# Patient Record
Sex: Male | Born: 1970 | Race: White | Hispanic: No | Marital: Married | Smoking: Former smoker
Health system: Southern US, Community
[De-identification: ages and names within clinical notes are randomized; demographics above are authoritative.]

## PROBLEM LIST (undated history)

## (undated) DIAGNOSIS — G90529 Complex regional pain syndrome I of unspecified lower limb: Secondary | ICD-10-CM

## (undated) DIAGNOSIS — T8859XA Other complications of anesthesia, initial encounter: Secondary | ICD-10-CM

## (undated) DIAGNOSIS — K449 Diaphragmatic hernia without obstruction or gangrene: Secondary | ICD-10-CM

## (undated) DIAGNOSIS — E119 Type 2 diabetes mellitus without complications: Secondary | ICD-10-CM

## (undated) DIAGNOSIS — Z9989 Dependence on other enabling machines and devices: Secondary | ICD-10-CM

## (undated) DIAGNOSIS — G4733 Obstructive sleep apnea (adult) (pediatric): Secondary | ICD-10-CM

## (undated) DIAGNOSIS — M1990 Unspecified osteoarthritis, unspecified site: Secondary | ICD-10-CM

## (undated) DIAGNOSIS — E669 Obesity, unspecified: Secondary | ICD-10-CM

## (undated) DIAGNOSIS — E785 Hyperlipidemia, unspecified: Secondary | ICD-10-CM

## (undated) DIAGNOSIS — E349 Endocrine disorder, unspecified: Secondary | ICD-10-CM

## (undated) DIAGNOSIS — K219 Gastro-esophageal reflux disease without esophagitis: Secondary | ICD-10-CM

## (undated) DIAGNOSIS — Z8489 Family history of other specified conditions: Secondary | ICD-10-CM

## (undated) DIAGNOSIS — I2510 Atherosclerotic heart disease of native coronary artery without angina pectoris: Secondary | ICD-10-CM

## (undated) HISTORY — DX: Endocrine disorder, unspecified: E34.9

## (undated) HISTORY — PX: FINGER SURGERY: SHX640

## (undated) HISTORY — PX: WISDOM TOOTH EXTRACTION: SHX21

## (undated) HISTORY — DX: Atherosclerotic heart disease of native coronary artery without angina pectoris: I25.10

## (undated) HISTORY — DX: Obesity, unspecified: E66.9

## (undated) HISTORY — DX: Gastro-esophageal reflux disease without esophagitis: K21.9

## (undated) HISTORY — DX: Type 2 diabetes mellitus without complications: E11.9

## (undated) HISTORY — DX: Obstructive sleep apnea (adult) (pediatric): G47.33

## (undated) HISTORY — DX: Dependence on other enabling machines and devices: Z99.89

## (undated) HISTORY — DX: Hyperlipidemia, unspecified: E78.5

## (undated) HISTORY — PX: HERNIA REPAIR: SHX51

## (undated) HISTORY — PX: CHOLECYSTECTOMY: SHX55

## (undated) HISTORY — DX: Diaphragmatic hernia without obstruction or gangrene: K44.9

## (undated) HISTORY — PX: COLONOSCOPY: SHX174

## (undated) HISTORY — PX: LEG SURGERY: SHX1003

---

## 1996-11-16 HISTORY — PX: LEG SURGERY: SHX1003

## 2010-04-29 ENCOUNTER — Encounter: Admission: RE | Admit: 2010-04-29 | Discharge: 2010-04-29 | Payer: Self-pay | Admitting: Gastroenterology

## 2012-04-05 DIAGNOSIS — K219 Gastro-esophageal reflux disease without esophagitis: Secondary | ICD-10-CM

## 2012-04-05 DIAGNOSIS — K449 Diaphragmatic hernia without obstruction or gangrene: Secondary | ICD-10-CM | POA: Insufficient documentation

## 2012-04-05 DIAGNOSIS — G4733 Obstructive sleep apnea (adult) (pediatric): Secondary | ICD-10-CM

## 2012-04-05 HISTORY — DX: Obstructive sleep apnea (adult) (pediatric): G47.33

## 2012-04-05 HISTORY — DX: Diaphragmatic hernia without obstruction or gangrene: K44.9

## 2012-04-05 HISTORY — DX: Gastro-esophageal reflux disease without esophagitis: K21.9

## 2012-12-01 DIAGNOSIS — E785 Hyperlipidemia, unspecified: Secondary | ICD-10-CM

## 2012-12-01 HISTORY — DX: Hyperlipidemia, unspecified: E78.5

## 2015-10-14 ENCOUNTER — Telehealth: Payer: Self-pay

## 2015-10-14 NOTE — Telephone Encounter (Signed)
[facility], [facility]

## 2016-08-05 ENCOUNTER — Encounter: Payer: Self-pay | Admitting: Cardiology

## 2016-08-05 ENCOUNTER — Telehealth: Payer: Self-pay | Admitting: Cardiology

## 2016-08-05 NOTE — Telephone Encounter (Signed)
Called pt and left message asking pt to call back to update Fm and medical Hx. °

## 2016-08-06 ENCOUNTER — Encounter: Payer: Self-pay | Admitting: Cardiology

## 2016-08-20 ENCOUNTER — Ambulatory Visit: Payer: Self-pay | Admitting: Cardiology

## 2017-08-13 ENCOUNTER — Telehealth: Payer: Self-pay | Admitting: *Deleted

## 2017-08-13 NOTE — Telephone Encounter (Signed)
NOTES SENT TO SCHEDULING.  °

## 2017-09-07 ENCOUNTER — Encounter: Payer: Self-pay | Admitting: Cardiology

## 2017-09-09 ENCOUNTER — Telehealth: Payer: Self-pay | Admitting: Cardiology

## 2017-09-09 DIAGNOSIS — G4733 Obstructive sleep apnea (adult) (pediatric): Secondary | ICD-10-CM

## 2017-09-09 NOTE — Telephone Encounter (Signed)
Message sent to Dr Radford Pax, Patient thinks he needs his pressure changed he has gained about 50 pound since he saw you last at [facility] in 2010. His machine is over 48 years old.   Patient was wondering if he needs a sleep study before his appt. If he needs a sleep study he wants to get that and his new machine before the end of the year because he has already met his deductible. Please advise Thanks, Gae Bon

## 2017-09-09 NOTE — Telephone Encounter (Signed)
Patient was wondering if he needs a sleep study before his appt. [facility] May[facility]er.

## 2017-09-09 NOTE — Telephone Encounter (Signed)
New message    Pam is calling to find out if anyone has talked to Dr. Mayford Knifeurner about this pt. She said she will be there until right before 5.

## 2017-09-15 ENCOUNTER — Telehealth: Payer: Self-pay | Admitting: *Deleted

## 2017-09-15 DIAGNOSIS — G4733 Obstructive sleep apnea (adult) (pediatric): Secondary | ICD-10-CM

## 2017-09-15 NOTE — Telephone Encounter (Signed)
-----   Message from Quintella Reichertraci R Turner, MD sent at 09/15/2017  3:26 PM EDT ----- Please order a CPAP titration in lab for new CPAP device

## 2017-09-15 NOTE — Telephone Encounter (Signed)
Informed patient of compliance results and verbalized understanding was indicated. Patient understands his apnea events are in normal range at 2.1. Patient understands his compliance is good and his current settings will not change. Patient was grateful for the call and thanked me.

## 2017-09-15 NOTE — Telephone Encounter (Signed)
-----   Message from Quintella Reichertraci R Turner, MD sent at 09/15/2017  3:25 PM EDT ----- Good AHI and compliance.  Continue current CPAP settings.

## 2017-09-16 ENCOUNTER — Encounter: Payer: Self-pay | Admitting: *Deleted

## 2017-09-16 NOTE — Telephone Encounter (Signed)
-----  Message from Sueanne Margarita, MD sent at 09/09/2017 11:49 PM EDT ----- Regarding: RE: PTT. or Sleep study Set up an in labCPAP titration  Traci Turner ----- Message ----- From: Freada Bergeron, CMA Sent: 09/09/2017   6:30 PM To: Sueanne Margarita, MD Subject: PTT. or Sleep study                            Patient thinks he needs his pressure changed he has gained about 50 pound since he saw you last at [facility] in 2010. His machine is over 31 years old.   Patient was wondering if he needs a sleep study before his appt. If he needs a sleep study he wants to get that and his new machine before the end of the year because he has already met his deductible. Please advise Thanks, Gae Bon

## 2017-09-16 NOTE — Telephone Encounter (Signed)
Informed patient of upcoming CPAP Titration and patient understanding was verbalized. Patient understands his Titration is scheduled for October 26 2017. Patient understands his Titration study will be done at [facility] sleep lab. Patient understands he will receive a sleep packet in a week or so. Patient understands to call if he does not receive the sleep packet in a timely manner. Patient agrees with treatment and thanked me for call.  

## 2017-09-22 ENCOUNTER — Encounter: Payer: Self-pay | Admitting: Cardiology

## 2017-09-22 ENCOUNTER — Telehealth: Payer: Self-pay | Admitting: *Deleted

## 2017-09-22 ENCOUNTER — Ambulatory Visit (INDEPENDENT_AMBULATORY_CARE_PROVIDER_SITE_OTHER): Payer: Managed Care, Other (non HMO) | Admitting: Cardiology

## 2017-09-22 VITALS — BP 128/92 | HR 93 | Ht 72.0 in | Wt 239.8 lb

## 2017-09-22 DIAGNOSIS — R42 Dizziness and giddiness: Secondary | ICD-10-CM | POA: Diagnosis not present

## 2017-09-22 DIAGNOSIS — G4733 Obstructive sleep apnea (adult) (pediatric): Secondary | ICD-10-CM | POA: Diagnosis not present

## 2017-09-22 DIAGNOSIS — R0602 Shortness of breath: Secondary | ICD-10-CM | POA: Diagnosis not present

## 2017-09-22 HISTORY — DX: Obstructive sleep apnea (adult) (pediatric): G47.33

## 2017-09-22 NOTE — Telephone Encounter (Signed)
PAP assistant spoke with the patient in the office today and informed him that the Air Mini CPAP only records his hours used and not his compliance where the results can be downloaded and reviewed at his office visit.  Patient was informed he can get a prescription for an air mini from Dr Mayford Knifeurner just for travel but not in the place of the regular CPAP machine.

## 2017-09-22 NOTE — Telephone Encounter (Signed)
-----   Message from AbercrombieDonnisha Robertson, RN sent at 09/22/2017  2:37 PM EST ----- Regarding: CPAP order CPAP order placed. FYI patient would like a travel friendly machine. Thanks

## 2017-09-22 NOTE — Patient Instructions (Signed)
Medication Instructions:  Your physician recommends that you continue on your current medications as directed. Please refer to the Current Medication list given to you today.  Labwork: None ordered  Testing/Procedures: Your physician has requested that you have an exercise tolerance test. For further information please visit [email or web address]. Please also follow instruction sheet, as given.   Follow-Up: Your physician wants you to follow-up in: 1 year with Dr. Mayford Knifeurner. You will receive a reminder letter in the mail two months in advance. If you don't receive a letter, please call our office to schedule the follow-up appointment.  Any Other Special Instructions Will Be Listed Below (If Applicable).     If you need a refill on your cardiac medications before your next appointment, please call your pharmacy.

## 2017-09-22 NOTE — Progress Notes (Signed)
Cardiology Office Note:    Date:  09/22/2017   ID:  Jorge [name], DOB 1971-02-14, MRN [id number]  PCP:  Warden Fillers, MD  Cardiologist:  Armanda Magic, MD   Referring MD: Warden Fillers, MD   Chief Complaint  Patient presents with  . Sleep Apnea    History of Present Illness:    Jorge [name] is a 46 y.o. male with a hx of OSA on CPAP, DM and obesity and is referred here for evaluation of OSA by his PCP Warden Fillers, MD.    He is here to get reestablished with a sleep MD.  He is doing well with his CPAP device but the device is not working as well for him and is shuttig off at night.  He would like to get a new machine as his is not working well and is old.  He tolerates the nasal pillow mask which works well for him and feels the pressure is adequate.  He sleeps well but when he wakes up in the am he does not always feel rested.  He does not nap during the day.  He denies any significant mouth or nasal dryness or nasal congestion.  He does not know if  he snores.  He also says that he is concerned about his heart.  He says that he intermittently gets an achiness in his jaw.  He cannot tell if it was from when he got all his teeth pulled.  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  He had another episode while at the beach working outside in the heat and again felt dizzy.  He denies any palpitations or syncope.  He says that occasionally he will have some right sided CP that he describes as a subtle discomfort that he has a hard time to describe.  He has DOE that is chronic and stable.     Past Medical History:  Diagnosis Date  . Diabetes mellitus type 2, controlled (HCC)   . GERD (gastroesophageal reflux disease) 04/05/2012  . Hiatal hernia 04/05/2012  . Hyperlipidemia 12/01/2012   HE PREFERS TO USE NIACIN AND BRAN TO LOWER HIS TRIGLYCERIDES AND CHOLESTEROL  . Obesity   . OSA (obstructive sleep  apnea) 09/22/2017  . OSA on CPAP 04/05/2012  . Testosterone deficiency     Past Surgical History:  Procedure Laterality Date  . CHOLECYSTECTOMY    . FINGER SURGERY    . HERNIA REPAIR    . LEG SURGERY Right   . WISDOM TOOTH EXTRACTION      Current Medications: Current Meds  Medication Sig  . aspirin 81 MG chewable tablet Chew 81 mg by mouth daily.  Marland Kitchen CINNAMON PO Take 1 tablet by mouth daily.  Marland Kitchen DAPAGLIFLOZIN-METFORMIN HCL ER PO Take 1 tablet by mouth 2 (two) times daily. UNKNOWN STRENGTH  . esomeprazole (NEXIUM) 20 MG capsule Take 20 mg by mouth 2 (two) times daily before a meal.  . GARLIC PO Take 1 tablet by mouth daily.  Marland Kitchen ibuprofen (ADVIL,MOTRIN) 800 MG tablet Take 800 mg by mouth every 8 (eight) hours as needed for moderate pain.  Marland Kitchen NIACIN CR PO Take 1 tablet by mouth daily.  Marland Kitchen OAT BRAN PO Take 1 Dose by mouth 2 (two) times daily.  . Omega-3 Fatty Acids (FISH OIL) 1000 MG CAPS Take 1 capsule by mouth 2 (two) times daily.  Kirt[place] Sink,  DISP, 3 ML 23G X 1" 3 ML MISC 1 each by Does not apply route. 1 EACH BY DOES NOT APPLY ROUTE EVERY 10 DAYS  . testosterone cypionate (DEPOTESTOSTERONE CYPIONATE) 200 MG/ML injection Inject 200 mg into the muscle. INJECT 1 ML (200 MG TOTAL) INTO THE MUSCLE EVERY 10 DAYS     Allergies:   Contrast media [iodinated diagnostic agents] and Lipitor [atorvastatin]   Social History   Socioeconomic History  . Marital status: Married    Spouse name: None  . Number of children: None  . Years of education: None  . Highest education level: None  Social Needs  . Financial resource strain: None  . Food insecurity - worry: None  . Food insecurity - inability: None  . Transportation needs - medical: None  . Transportation needs - non-medical: None  Occupational History  . None  Tobacco Use  . Smoking status: Former Smoker    Last attempt to quit: 09/22/2012    Years since quitting: 5.0  . Smokeless tobacco: Never Used  Substance and Sexual  Activity  . Alcohol use: No  . Drug use: No  . Sexual activity: None  Other Topics Concern  . None  Social History Narrative  . None     Family History: The patient's family history includes Alzheimer's disease in his mother; Valvular heart disease in his brother and father.  ROS:   Please see the history of present illness.    ROS  All other systems reviewed and negative.   EKGs/Labs/Other Studies Reviewed:    The following studies were reviewed today: CPAP download  EKG:  EKG is ordered today and showed NSR at 89bpm with no ST changes  Recent Labs: No results found for requested labs within last 8760 hours.   Recent Lipid Panel No results found for: CHOL, TRIG, HDL, CHOLHDL, VLDL, LDLCALC, LDLDIRECT  Physical Exam:    VS:  BP (!) 128/92   Pulse 93   Ht 6' (1.829 m)   Wt 239 lb 12.8 oz (108.8 kg)   SpO2 97%   BMI 32.52 kg/m     Wt Readings from Last 3 Encounters:  09/22/17 239 lb 12.8 oz (108.8 kg)     GEN:  Well nourished, well developed in no acute distress HEENT: Normal NECK: No JVD; No carotid bruits LYMPHATICS: No lymphadenopathy CARDIAC: RRR, no murmurs, rubs, gallops RESPIRATORY:  Clear to auscultation without rales, wheezing or rhonchi  ABDOMEN: Soft, non-tender, non-distended MUSCULOSKELETAL:  No edema; No deformity  SKIN: Warm and dry NEUROLOGIC:  Alert and oriented x 3 PSYCHIATRIC:  Normal affect   ASSESSMENT:    1. OSA (obstructive sleep apnea)   2. SOB (shortness of breath)   3. Dizziness    PLAN:    In order of problems listed above:  1.  OSA - the patient is tolerating PAP therapy well without any problems. The patient has been using and benefiting from CPAP use and will continue to benefit from therapy.  I will order a new ResMed CPAP with heated humidity.  We will actually get a 2-week auto titration from 4-18 cm water pressure to make sure he is on an adequate pressure setting.  2.  SOB -unclear if this is related to underlying  obesity.  He has no family history of coronary disease but does have a remote history of smoking and is a diabetic so he does have risk factors for coronary disease.  His EKG shows normal sinus rhythm with no ST  changes so I will order a exercise treadmill test to rule out ischemia.  3.  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  Suspect he got dehydrated.  EKG is normal.  He denies any history of palpitations.  He has had no syncopal episodes.     Medication Adjustments/Labs and Tests Ordered: Current medicines are reviewed at length with the patient today.  Concerns regarding medicines are outlined above.  No orders of the defined types were placed in this encounter.  No orders of the defined types were placed in this encounter.   Signed, Armanda Magicraci Turner, MD  09/22/2017 2:22 PM    [facility]

## 2017-09-23 NOTE — Telephone Encounter (Signed)
Informed patient of upcoming CPAP Titration and patient understanding was verbalized. Patient understands his Titration is scheduled for October 26 2017. Patient understands [facility] Patient understands he will receive a sleep packet in a week or so. Patient understands to call if he does not receive the sleep packet in a timely manner. Patient agrees with treatment and thanked me for call.

## 2017-09-23 NOTE — Telephone Encounter (Signed)
  -----   Message from Quintella Reichertraci R Turner, MD sent at 09/09/2017 11:49 PM EDT ----- Regarding: RE: PTT. or Sleep study Set up an in labCPAP titration  Armanda Magicraci Turner

## 2017-10-19 ENCOUNTER — Ambulatory Visit (INDEPENDENT_AMBULATORY_CARE_PROVIDER_SITE_OTHER): Payer: Managed Care, Other (non HMO)

## 2017-10-19 DIAGNOSIS — R0602 Shortness of breath: Secondary | ICD-10-CM

## 2017-10-19 LAB — EXERCISE TOLERANCE TEST
CSEPED: 6 min
CSEPEDS: 16 s
CSEPEW: 7.4 METS
CSEPHR: 90 %
CSEPPHR: 155 {beats}/min
MPHR: 172 {beats}/min
RPE: 14
Rest HR: 90 {beats}/min

## 2017-10-20 ENCOUNTER — Telehealth: Payer: Self-pay

## 2017-10-20 DIAGNOSIS — I10 Essential (primary) hypertension: Secondary | ICD-10-CM

## 2017-10-20 NOTE — Telephone Encounter (Signed)
Notes recorded by Phineas Semenobertson, Adonus Uselman, RN on 10/20/2017 at 12:46 PM EST Reviewed results with patient. Informed patient that Dr. Mayford Knifeurner recommends a 24 BP monitor due to high BP. Informed patient that our schedulers will contact him to make that appointment. Patient verbalized understanding and thanked me for the call.  Notes recorded by Quintella Reicherturner, Traci R, MD on 10/20/2017 at 5:35 AM EST Stress test normal but BP too high- please order a 24 hour BP monitor

## 2017-10-20 NOTE — Progress Notes (Signed)
Stress test normal but BP too high- please order a 24 hour BP monitor

## 2017-10-26 ENCOUNTER — Ambulatory Visit (HOSPITAL_BASED_OUTPATIENT_CLINIC_OR_DEPARTMENT_OTHER): Payer: Managed Care, Other (non HMO) | Attending: Cardiology | Admitting: Cardiology

## 2017-10-26 VITALS — Ht 72.0 in | Wt 240.0 lb

## 2017-10-26 DIAGNOSIS — G4733 Obstructive sleep apnea (adult) (pediatric): Secondary | ICD-10-CM | POA: Diagnosis present

## 2017-11-03 NOTE — Progress Notes (Signed)
   NAME: Jorge [name] DATE OF BIRTH:  03/16/1971 MEDICAL RECORD:  [id number]  LOCATION: [facility]  PHYSICIAN: Lynard Postlewait  DATE OF STUDY: 10/26/2017  SLEEP STUDY TYPE: Positive Airway Pressure Titration               REFERRING PHYSICIAN: Akirra Lacerda R, MD  INDICATION FOR STUDY: OSA  HEIGHT: 6' (182.9 cm)  WEIGHT: 240 lb (108.9 kg)    Body mass index is 32.55 kg/m.  NECK SIZE: 17 in.  CLINICAL INFORMATION The patient is referred for a CPAP titration to treat sleep apnea.  SLEEP STUDY TECHNIQUE As per the AASM Manual for the Scoring of Sleep and Associated Events v2.3 (April 2016) with a hypopnea requiring 4% desaturations.  The channels recorded and monitored were frontal, central and occipital EEG, electrooculogram (EOG), submentalis EMG (chin), nasal and oral airflow, thoracic and abdominal wall motion, anterior tibialis EMG, snore microphone, electrocardiogram, and pulse oximetry. Continuous positive airway pressure (CPAP) was initiated at the beginning of the study and titrated to treat sleep-disordered breathing.  MEDICATIONS Medications self-administered by patient taken the night of the study : PIOGLITAZONE, XIGDUO XR  TECHNICIAN COMMENTS Comments added by technician: PATIENT WAS ORDERED AS A CPAP TITRATION. Comments added by scorer: N/A  RESPIRATORY PARAMETERS Optimal PAP Pressure (cm): 14  AHI at Optimal Pressure (/hr):0.0 Overall Minimal O2 (%):85.00  Supine % at Optimal Pressure (%):100 Minimal O2 at Optimal Pressure (%): 91.0   SLEEP ARCHITECTURE The study was initiated at 10:47:24 PM and ended at 5:25:09 AM.  Sleep onset time was 5.6 minutes and the sleep efficiency was 96.4%. The total sleep time was 383.5 minutes.  The patient spent 4.82% of the night in stage N1 sleep, 62.19% in stage N2 sleep, 5.61% in stage N3 and 27.38% in REM.Stage REM latency was 85.5 minutes  Wake after sleep onset was 8.6. Alpha intrusion was absent.  Supine sleep was 83.96%.  CARDIAC DATA The 2 lead EKG demonstrated sinus rhythm. The mean heart rate was 76.33 beats per minute. Other EKG findings include: PVCs.  LEG MOVEMENT DATA The total Periodic Limb Movements of Sleep (PLMS) were 269. The PLMS index was 42.09. A PLMS index of <15 is considered normal in adults.  IMPRESSIONS - The optimal PAP pressure was 14 cm of water. - Central sleep apnea was not noted during this titration (CAI = 0.6/h). - Moderate oxygen desaturations were observed during this titration (min O2 = 85.00%). - The patient snored with soft snoring volume during this titration study. - 2-lead EKG demonstrated: PVCs - Moderate periodic limb movements were observed during this study. Arousals associated with PLMs were rare.  DIAGNOSIS - Obstructive Sleep Apnea (327.23 [G47.33 ICD-10])  RECOMMENDATIONS - Trial of CPAP therapy on 14 cm H2O with a Large size Philips Respironics Nasal Pillow Mask Nuance Pro Gel mask and heated humidification. - Avoid alcohol, sedatives and other CNS depressants that may worsen sleep apnea and disrupt normal sleep architecture. - Sleep hygiene should be reviewed to assess factors that may improve sleep quality. - Weight management and regular exercise should be initiated or continued. - Return to Sleep Center for re-evaluation after 10 weeks of therapy  Martyn Timme Diplomate, American Board of Sleep Medicine  ELECTRONICALLY SIGNED ON:  11/03/2017, 9:11 PM [facility] PH: [phone]   FX: [phone] ACCREDITED BY THE AMERICAN ACADEMY OF SLEEP MEDICINE  

## 2017-11-03 NOTE — Procedures (Signed)
   NAME: Jorge BreslowBruce [name] DATE OF BIRTH:  10/19/1971 MEDICAL RECORD NUMBER [id number]  LOCATION: [facility]  PHYSICIAN: Tai Skelly  DATE OF STUDY: 10/26/2017  SLEEP STUDY TYPE: Positive Airway Pressure Titration               REFERRING PHYSICIAN: Quintella Reicherturner, Lacrisha Bielicki R, MD  INDICATION FOR STUDY: OSA  HEIGHT: 6' (182.9 cm)  WEIGHT: 240 lb (108.9 kg)    Body mass index is 32.55 kg/m.  NECK SIZE: 17 in.  CLINICAL INFORMATION The patient is referred for a CPAP titration to treat sleep apnea.  SLEEP STUDY TECHNIQUE As per the AASM Manual for the Scoring of Sleep and Associated Events v2.3 (April 2016) with a hypopnea requiring 4% desaturations.  The channels recorded and monitored were frontal, central and occipital EEG, electrooculogram (EOG), submentalis EMG (chin), nasal and oral airflow, thoracic and abdominal wall motion, anterior tibialis EMG, snore microphone, electrocardiogram, and pulse oximetry. Continuous positive airway pressure (CPAP) was initiated at the beginning of the study and titrated to treat sleep-disordered breathing.  MEDICATIONS Medications self-administered by patient taken the night of the study : PIOGLITAZONE, XIGDUO XR  TECHNICIAN COMMENTS Comments added by technician: PATIENT WAS ORDERED AS A CPAP TITRATION. Comments added by scorer: N/A  RESPIRATORY PARAMETERS Optimal PAP Pressure (cm): 14  AHI at Optimal Pressure (/hr):0.0 Overall Minimal O2 (%):85.00  Supine % at Optimal Pressure (%):100 Minimal O2 at Optimal Pressure (%): 91.0   SLEEP ARCHITECTURE The study was initiated at 10:47:24 PM and ended at 5:25:09 AM.  Sleep onset time was 5.6 minutes and the sleep efficiency was 96.4%. The total sleep time was 383.5 minutes.  The patient spent 4.82% of the night in stage N1 sleep, 62.19% in stage N2 sleep, 5.61% in stage N3 and 27.38% in REM.Stage REM latency was 85.5 minutes  Wake after sleep onset was 8.6. Alpha intrusion was absent.  Supine sleep was 83.96%.  CARDIAC DATA The 2 lead EKG demonstrated sinus rhythm. The mean heart rate was 76.33 beats per minute. Other EKG findings include: PVCs.  LEG MOVEMENT DATA The total Periodic Limb Movements of Sleep (PLMS) were 269. The PLMS index was 42.09. A PLMS index of <15 is considered normal in adults.  IMPRESSIONS - The optimal PAP pressure was 14 cm of water. - Central sleep apnea was not noted during this titration (CAI = 0.6/h). - Moderate oxygen desaturations were observed during this titration (min O2 = 85.00%). - The patient snored with soft snoring volume during this titration study. - 2-lead EKG demonstrated: PVCs - Moderate periodic limb movements were observed during this study. Arousals associated with PLMs were rare.  DIAGNOSIS - Obstructive Sleep Apnea (327.23 [G47.33 ICD-10])  RECOMMENDATIONS - Trial of CPAP therapy on 14 cm H2O with a Large size Philips Respironics Nasal Pillow Mask Nuance Pro Gel mask and heated humidification. - Avoid alcohol, sedatives and other CNS depressants that may worsen sleep apnea and disrupt normal sleep architecture. - Sleep hygiene should be reviewed to assess factors that may improve sleep quality. - Weight management and regular exercise should be initiated or continued. - Return to Sleep Center for re-evaluation after 10 weeks of therapy  Armanda Magicraci Solan Vosler Diplomate, American Board of Sleep Medicine  ELECTRONICALLY SIGNED ON:  11/03/2017, 9:11 PM [place] [facility] PH: [phone]   FX: [phone] ACCREDITED BY THE AMERICAN ACADEMY OF SLEEP MEDICINE

## 2017-11-04 ENCOUNTER — Telehealth: Payer: Self-pay | Admitting: *Deleted

## 2017-11-04 NOTE — Telephone Encounter (Signed)
-----   Message from Quintella Reichertraci R Turner, MD sent at 11/03/2017  9:15 PM EST ----- Please let patient know that they had a successful PAP titration and let DME know that orders are in EPIC.  Please set up 10 week OV with me.

## 2017-11-04 NOTE — Telephone Encounter (Deleted)
  Turner, Traci R, MD  Magali Bray G, CMA        Please let patient know that they had a successful PAP titration and let DME know that orders are in EPIC. Please set up 10 week OV with me.     

## 2017-11-04 NOTE — Telephone Encounter (Signed)
Informed patient of sleep study results and patient understanding was verbalized. Patient understands he had a successful CPAP Titration and Dr Mayford Knifeurner has ordered him a CPAP.  Patient understands [facility]  Patient understands he needs a recent appointment with Dr Mayford Knifeurner to obtain his new CPAP unit. He understands to call if [facility] not [facility] with new setup in a timely manner. He understands he will be called once [facility] has been [facility] that [facility] has received his new machine to schedule 10 week follow up appointment.  [facility] notified of new cpap order in epic Please add to [place] [facility] He was grateful for the call and thanked me

## 2017-11-19 ENCOUNTER — Telehealth: Payer: Self-pay | Admitting: *Deleted

## 2017-11-19 NOTE — Telephone Encounter (Signed)
Patient states he has to pay out of pocket for his CPAP because he has a $5000 deductible for the new year. Patient states he can not afford this unit at this time and he will call back when he figure out how he can afford the unit.

## 2017-11-19 NOTE — Telephone Encounter (Signed)
-----   Message from Traci R Turner, MD sent at 11/03/2017  9:15 PM EST ----- Please let patient know that they had a successful PAP titration and let DME know that orders are in EPIC.  Please set up 10 week OV with me.  

## 2017-11-26 ENCOUNTER — Ambulatory Visit (INDEPENDENT_AMBULATORY_CARE_PROVIDER_SITE_OTHER): Payer: Managed Care, Other (non HMO)

## 2017-11-26 DIAGNOSIS — I10 Essential (primary) hypertension: Secondary | ICD-10-CM | POA: Diagnosis not present

## 2018-01-20 ENCOUNTER — Telehealth: Payer: Self-pay | Admitting: *Deleted

## 2018-01-20 NOTE — Telephone Encounter (Signed)
Patient called back today to say he was ready to move forward and get his CPAP machine. PAP assistant [facility]  So he can get set up.

## 2018-02-17 ENCOUNTER — Telehealth: Payer: Self-pay

## 2018-02-17 NOTE — Telephone Encounter (Signed)
-----   Message from Reesa Chew, CMA sent at 02/17/2018  9:59 AM EDT ----- Regarding: 24 hour monitor Patient calling about results of 24 hour monitor. Please call patient.

## 2018-02-17 NOTE — Telephone Encounter (Signed)
Patient called stating [facility]  [facility]. [facility] spoke to [place] who states the patient was suppose to call [facility] back after checking with his insurance for his financial responsibility to say when he was ready for setup. [place] will reach out to the patient today to set patient up if he is ready.  Reached out to the patient left message to inform him to look for a [facility]  [facility].  [place] would like [place] office.

## 2018-02-17 NOTE — Telephone Encounter (Signed)
I spoke with patient, pt made aware of normal BP monitor results and continue with current mediation therapy. Patient denies any other concerns and thankful for the call.

## 2018-03-21 NOTE — Telephone Encounter (Signed)
Patient has a 10 week follow up appointment scheduled for 05/11/2018 at 2:20. Patient understands she needs to keep this appointment for insurance compliance. Patient was grateful for the call and thanked me.

## 2018-04-01 ENCOUNTER — Telehealth: Payer: Self-pay | Admitting: *Deleted

## 2018-04-01 NOTE — Telephone Encounter (Signed)
Patient is aware and agreeable to AHI being within range at 0.5. Patient is aware and agreeable to being in compliance with machine usage. Patient is aware and agreeable to no change in current pressures.   

## 2018-04-01 NOTE — Telephone Encounter (Signed)
-----   Message from Quintella Reichert, MD sent at 03/30/2018  2:58 PM EDT ----- Good AHI and compliance.  Continue current PAP settings.

## 2018-05-11 ENCOUNTER — Ambulatory Visit (INDEPENDENT_AMBULATORY_CARE_PROVIDER_SITE_OTHER): Payer: Managed Care, Other (non HMO) | Admitting: Cardiology

## 2018-05-11 ENCOUNTER — Encounter: Payer: Self-pay | Admitting: Cardiology

## 2018-05-11 ENCOUNTER — Encounter (INDEPENDENT_AMBULATORY_CARE_PROVIDER_SITE_OTHER): Payer: Self-pay

## 2018-05-11 VITALS — BP 147/79 | HR 95 | Ht 72.0 in | Wt 243.2 lb

## 2018-05-11 DIAGNOSIS — G4733 Obstructive sleep apnea (adult) (pediatric): Secondary | ICD-10-CM | POA: Diagnosis not present

## 2018-05-11 NOTE — Patient Instructions (Signed)
Medication Instructions:  Your physician recommends that you continue on your current medications as directed. Please refer to the Current Medication list given to you today.  If you need a refill on your cardiac medications, please contact your pharmacy first.  Labwork: None ordered   Testing/Procedures: None ordered   Follow-Up: Your physician wants you to follow-up in: 1 year with Dr. Turner. You will receive a reminder letter in the mail two months in advance. If you don't receive a letter, please call our office to schedule the follow-up appointment.  Any Other Special Instructions Will Be Listed Below (If Applicable).   Thank you for choosing [facility]    Rena Aloys Hupfer, RN  [phone]  If you need a refill on your cardiac medications before your next appointment, please call your pharmacy.   

## 2018-05-11 NOTE — Progress Notes (Signed)
Cardiology Office Note:    Date:  05/11/2018   ID:  Jorge BreslowBruce [name], DOB 08/11/1971, MRN [id number]  PCP:  Warden FillersLambeth, John, MD  Cardiologist:  No primary care provider on file.    Referring MD: Warden FillersLambeth, John, MD   Chief Complaint  Patient presents with  . Sleep Apnea    History of Present Illness:    Jorge [name] is a 47 y.o. male with a hx of obstructive sleep apnea on CPAP.  He last saw me last November to establish a new sleep medicine doctor.  He was having problems with his CPAP device to get a new device.  He underwent CPAP titration in the lab to 14 cm H2O.  He is now here for follow-up.He is doing well with his CPAP device and thinks that he has gotten used to it.  He tolerates the mask and feels the pressure is adequate.  Since going on CPAP he feels rested in the am and has no significant daytime sleepiness.  He denies any significant mouth or nasal dryness or nasal congestion.  He does not think that he snores.     Past Medical History:  Diagnosis Date  . Diabetes mellitus type 2, controlled (HCC)   . GERD (gastroesophageal reflux disease) 04/05/2012  . Hiatal hernia 04/05/2012  . Hyperlipidemia 12/01/2012   HE PREFERS TO USE NIACIN AND BRAN TO LOWER HIS TRIGLYCERIDES AND CHOLESTEROL  . Obesity   . OSA (obstructive sleep apnea) 09/22/2017  . OSA on CPAP 04/05/2012  . Testosterone deficiency     Past Surgical History:  Procedure Laterality Date  . CHOLECYSTECTOMY    . FINGER SURGERY    . HERNIA REPAIR    . LEG SURGERY Right   . WISDOM TOOTH EXTRACTION      Current Medications: Current Meds  Medication Sig  . aspirin 81 MG chewable tablet Chew 81 mg by mouth daily.  Marland Kitchen. CINNAMON PO Take 1 tablet by mouth daily.  Marland Kitchen. DAPAGLIFLOZIN-METFORMIN HCL ER PO Take 1 tablet by mouth 2 (two) times daily. UNKNOWN STRENGTH  . esomeprazole (NEXIUM) 20 MG capsule Take 20 mg by mouth 2 (two) times daily before a meal.  . GARLIC PO Take 1 tablet by mouth daily.  Marland Kitchen. ibuprofen  (ADVIL,MOTRIN) 800 MG tablet Take 800 mg by mouth every 8 (eight) hours as needed for moderate pain.  Marland Kitchen. NIACIN CR PO Take 1 tablet by mouth daily.  Marland Kitchen. OAT BRAN PO Take 1 Dose by mouth 2 (two) times daily.  . Omega-3 Fatty Acids (FISH OIL) 1000 MG CAPS Take 1 capsule by mouth 2 (two) times daily.  . SYRINGE-NEEDLE, DISP, 3 ML 23G X 1" 3 ML MISC 1 each by Does not apply route. 1 EACH BY DOES NOT APPLY ROUTE EVERY 10 DAYS  . testosterone cypionate (DEPOTESTOSTERONE CYPIONATE) 200 MG/ML injection Inject 200 mg into the muscle. INJECT 1 ML (200 MG TOTAL) INTO THE MUSCLE EVERY 10 DAYS     Allergies:   Atorvastatin; Iodinated diagnostic agents; and Other   Social History   Socioeconomic History  . Marital status: Married    Spouse name: Not on file  . Number of children: Not on file  . Years of education: Not on file  . Highest education level: Not on file  Occupational History  . Not on file  Social Needs  . Financial resource strain: Not on file  . Food insecurity:    Worry: Not on file    Inability: Not on file  .  Transportation needs:    Medical: Not on file    Non-medical: Not on file  Tobacco Use  . Smoking status: Former Smoker    Last attempt to quit: 09/22/2012    Years since quitting: 5.6  . Smokeless tobacco: Never Used  Substance and Sexual Activity  . Alcohol use: No  . Drug use: No  . Sexual activity: Not on file  Lifestyle  . Physical activity:    Days per week: Not on file    Minutes per session: Not on file  . Stress: Not on file  Relationships  . Social connections:    Talks on phone: Not on file    Gets together: Not on file    Attends religious service: Not on file    Active member of club or organization: Not on file    Attends meetings of clubs or organizations: Not on file    Relationship status: Not on file  Other Topics Concern  . Not on file  Social History Narrative  . Not on file     Family History: The patient's family history includes  Alzheimer's disease in his mother; Valvular heart disease in his brother and father.  ROS:   Please see the history of present illness.    ROS  All other systems reviewed and negative.   EKGs/Labs/Other Studies Reviewed:    The following studies were reviewed today: PAP downlod  EKG:  EKG is not ordered today.    Recent Labs: No results found for requested labs within last 8760 hours.   Recent Lipid Panel No results found for: CHOL, TRIG, HDL, CHOLHDL, VLDL, LDLCALC, LDLDIRECT  Physical Exam:    VS:  Ht 6' (1.829 m)   Wt 243 lb 3.2 oz (110.3 kg)   BMI 32.98 kg/m     Wt Readings from Last 3 Encounters:  05/11/18 243 lb 3.2 oz (110.3 kg)  10/26/17 240 lb (108.9 kg)  09/22/17 239 lb 12.8 oz (108.8 kg)     GEN:  Well nourished, well developed in no acute distress HEENT: Normal NECK: No JVD; No carotid bruits LYMPHATICS: No lymphadenopathy CARDIAC: RRR, no murmurs, rubs, gallops RESPIRATORY:  Clear to auscultation without rales, wheezing or rhonchi  ABDOMEN: Soft, non-tender, non-distended MUSCULOSKELETAL:  No edema; No deformity  SKIN: Warm and dry NEUROLOGIC:  Alert and oriented x 3 PSYCHIATRIC:  Normal affect   ASSESSMENT:    1. OSA (obstructive sleep apnea)    PLAN:    In order of problems listed above:  1.  OSA - the patient is tolerating PAP therapy well without any problems. The PAP download was reviewed today and showed an AHI of 0.4/hr on 14 cm H2O with 73% compliance in using more than 4 hours nightly.  The patient has been using and benefiting from PAP use and will continue to benefit from therapy.      Medication Adjustments/Labs and Tests Ordered: Current medicines are reviewed at length with the patient today.  Concerns regarding medicines are outlined above.  No orders of the defined types were placed in this encounter.  No orders of the defined types were placed in this encounter.   Signed, Armanda Magic, MD  05/11/2018 2:49 PM    [facility]

## 2019-06-28 ENCOUNTER — Telehealth: Payer: Self-pay

## 2019-06-28 NOTE — Telephone Encounter (Signed)
Called to offer virtual visit for patient with Dr Radford Pax (sleep) but he stated he has lost his job and can not afford appointment at this time. He stated he is doing well and no complaints at this time.

## 2021-10-16 ENCOUNTER — Encounter: Payer: Self-pay | Admitting: Cardiology

## 2021-10-24 ENCOUNTER — Ambulatory Visit: Payer: Managed Care, Other (non HMO) | Admitting: Cardiology

## 2022-01-21 ENCOUNTER — Encounter: Payer: Self-pay | Admitting: Cardiology

## 2022-01-21 ENCOUNTER — Ambulatory Visit (INDEPENDENT_AMBULATORY_CARE_PROVIDER_SITE_OTHER): Payer: Commercial Managed Care - PPO | Admitting: Cardiology

## 2022-01-21 ENCOUNTER — Other Ambulatory Visit: Payer: Self-pay

## 2022-01-21 VITALS — BP 126/76 | HR 99 | Ht 72.0 in | Wt 253.4 lb

## 2022-01-21 DIAGNOSIS — G4733 Obstructive sleep apnea (adult) (pediatric): Secondary | ICD-10-CM | POA: Diagnosis not present

## 2022-01-21 DIAGNOSIS — R072 Precordial pain: Secondary | ICD-10-CM

## 2022-01-21 DIAGNOSIS — G441 Vascular headache, not elsewhere classified: Secondary | ICD-10-CM | POA: Diagnosis not present

## 2022-01-21 DIAGNOSIS — R079 Chest pain, unspecified: Secondary | ICD-10-CM

## 2022-01-21 LAB — BASIC METABOLIC PANEL
BUN/Creatinine Ratio: 17 (ref 9–20)
BUN: 15 mg/dL (ref 6–24)
CO2: 24 mmol/L (ref 20–29)
Calcium: 9.4 mg/dL (ref 8.7–10.2)
Chloride: 102 mmol/L (ref 96–106)
Creatinine, Ser: 0.86 mg/dL (ref 0.76–1.27)
Glucose: 191 mg/dL — ABNORMAL HIGH (ref 70–99)
Potassium: 4.5 mmol/L (ref 3.5–5.2)
Sodium: 139 mmol/L (ref 134–144)
eGFR: 104 mL/min/{1.73_m2} (ref >59)

## 2022-01-21 MED ORDER — PREDNISONE 50 MG PO TABS: ORAL_TABLET | ORAL | 0 refills | Status: DC

## 2022-01-21 MED ORDER — DIPHENHYDRAMINE HCL 50 MG PO CAPS: ORAL_CAPSULE | ORAL | 0 refills | Status: DC

## 2022-01-21 MED ORDER — METOPROLOL TARTRATE 100 MG PO TABS: 100.0000 mg | ORAL_TABLET | Freq: Once | ORAL | 0 refills | Status: DC

## 2022-01-21 NOTE — Progress Notes (Signed)
? ?Cardiology CONSULT Note   ? ?Date:  01/21/2022  ? ?ID:  Jorge [name], DOB 03/16/1971, MRN [id number] ? ?PCP:  Warden FillersLambeth, John, MD  ?Cardiologist:  Armanda Magicraci Caitlan Chauca, MD  ? ?Chief Complaint  ?Patient presents with  ? Sleep Apnea  ? ? ?History of Present Illness:  ?Jorge [name] is a 51 y.o. male who is being seen today for the evaluation of OSA at the request of Warden FillersLambeth, John, MD. ? ?Jorge [name] is a 51 y.o. male with a hx of obstructive sleep apnea on CPAP. His OSA was initially dx in 2010 with sleep study showing moderate OSA with an AHI of 21/hr but increased to 68/hr during REM sleep.  He was placed on CPAP at 14cm H2O.  He was lost to followup and is not here to reestablish care. ? ?He is doing well with his CPAP device and thinks that he has gotten used to it.  He tolerates the mask and feels the pressure is adequate.  For the most part he feels rested in the am and has no significant daytime sleepiness but sometimes he feels sleepy in the am.  He denies any significant mouth or nasal dryness or nasal congestion.  He does not think that he snores.   He thinks that he needs a new device and needs some new supplies.  ? ?He is having episodes of chest pain that is somewhat atypical.  It is nonexertional but radiates into his jaw.  He has some GERD and is not sure that it is not reflux but is getting more frequent.  He has not had any SOB or DOE.  He also says that occasionally he will have a severe headache at the time of climax when having intercourse with his wife.   ? ?Past Medical History:  ?Diagnosis Date  ? Diabetes mellitus type 2, controlled (HCC)   ? GERD (gastroesophageal reflux disease) 04/05/2012  ? Hiatal hernia 04/05/2012  ? Hyperlipidemia 12/01/2012  ? HE PREFERS TO USE NIACIN AND BRAN TO LOWER HIS TRIGLYCERIDES AND CHOLESTEROL  ? Obesity   ? OSA (obstructive sleep apnea) 09/22/2017  ? OSA on CPAP 04/05/2012  ? Testosterone deficiency   ? ? ?Past Surgical History:  ?Procedure Laterality Date  ?  CHOLECYSTECTOMY    ? FINGER SURGERY    ? HERNIA REPAIR    ? LEG SURGERY Right   ? WISDOM TOOTH EXTRACTION    ? ? ?Current Medications: ?Current Meds  ?Medication Sig  ? aspirin 81 MG chewable tablet Chew 81 mg by mouth daily.  ? CINNAMON PO Take 1 tablet by mouth daily.  ? esomeprazole (NEXIUM) 20 MG capsule Take 20 mg by mouth 2 (two) times daily before a meal.  ? gabapentin (NEURONTIN) 300 MG capsule Take 300 mg by mouth 2 (two) times daily.  ? GARLIC PO Take 1 tablet by mouth daily.  ? ibuprofen (ADVIL,MOTRIN) 800 MG tablet Take 800 mg by mouth every 8 (eight) hours as needed for moderate pain.  ? metFORMIN (GLUCOPHAGE) 500 MG tablet Take 1,000 mg by mouth 2 (two) times daily with a meal.  ? NIACIN CR PO Take 1 tablet by mouth daily.  ? OAT BRAN PO Take 1 Dose by mouth 2 (two) times daily.  ? Omega-3 Fatty Acids (FISH OIL) 1000 MG CAPS Take 1 capsule by mouth 2 (two) times daily.  ? Pioglitazone HCl (ACTOS PO) Take 30 mg by mouth daily.  ? Semaglutide (OZEMPIC, 1 MG/DOSE, [place]) Inject into the skin once  a week.  ? sildenafil (REVATIO) 20 MG tablet Take 20 mg by mouth as needed.  ? SYRINGE-NEEDLE, DISP, 3 ML 23G X 1" 3 ML MISC 1 each by Does not apply route. 1 EACH BY DOES NOT APPLY ROUTE EVERY 10 DAYS  ? testosterone cypionate (DEPOTESTOSTERONE CYPIONATE) 200 MG/ML injection Inject 200 mg into the muscle. INJECT 1 ML (200 MG TOTAL) INTO THE MUSCLE EVERY 10 DAYS  ? ? ?Allergies:   Atorvastatin, Iodinated contrast media, and Other  ? ?Social History  ? ?Socioeconomic History  ? Marital status: Married  ?  Spouse name: Not on file  ? Number of children: Not on file  ? Years of education: Not on file  ? Highest education level: Not on file  ?Occupational History  ? Not on file  ?Tobacco Use  ? Smoking status: Former  ?  Types: Cigarettes  ?  Quit date: 09/22/2012  ?  Years since quitting: 9.3  ? Smokeless tobacco: Never  ?Vaping Use  ? Vaping Use: Never used  ?Substance and Sexual Activity  ? Alcohol use: No  ? Drug  use: No  ? Sexual activity: Not on file  ?Other Topics Concern  ? Not on file  ?Social History Narrative  ? Not on file  ? ?Social Determinants of Health  ? ?Financial Resource Strain: Not on file  ?Food Insecurity: Not on file  ?Transportation Needs: Not on file  ?Physical Activity: Not on file  ?Stress: Not on file  ?Social Connections: Not on file  ?  ? ?Family History:  The patient's family history includes Alzheimer's disease in his mother; Valvular heart disease in his brother and father.  ? ?ROS:   ?Please see the history of present illness.    ?ROS All other systems reviewed and are negative. ? ?No flowsheet data found. ? ? ? ? ?PHYSICAL EXAM:   ?VS:  BP 126/76   Pulse 99   Ht 6' (1.829 m)   Wt 253 lb 6.4 oz (114.9 kg)   SpO2 98%   BMI 34.37 kg/m?    ?GEN: Well nourished, well developed, in no acute distress  ?HEENT: normal  ?Neck: no JVD, carotid bruits, or masses ?Cardiac: RRR; no murmurs, rubs, or gallops,no edema.  Intact distal pulses bilaterally.  ?Respiratory:  clear to auscultation bilaterally, normal work of breathing ?GI: soft, nontender, nondistended, + BS ?MS: no deformity or atrophy  ?Skin: warm and dry, no rash ?Neuro:  Alert and Oriented x 3, Strength and sensation are intact ?Psych: euthymic mood, full affect ? ?Wt Readings from Last 3 Encounters:  ?01/21/22 253 lb 6.4 oz (114.9 kg)  ?05/11/18 243 lb 3.2 oz (110.3 kg)  ?10/26/17 240 lb (108.9 kg)  ?  ? ? ?Studies/Labs Reviewed:  ? ?EKG:  EKG is not ordered today.   ? ?Recent Labs: ?No results found for requested labs within last 8760 hours.  ? ?Lipid Panel ?No results found for: CHOL, TRIG, HDL, CHOLHDL, VLDL, LDLCALC, LDLDIRECT ? ? ?Additional studies/ records that were reviewed today include:  ?Notes from PCP ? ? ? ?ASSESSMENT:   ? ?1. OSA (obstructive sleep apnea)   ?2. Chest pain of uncertain etiology   ?3. Other vascular headache   ? ? ? ?PLAN:  ?In order of problems listed above: ? ?OSA - The patient is tolerating PAP therapy well  without any problems. The PAP download performed by his DME was personally reviewed and interpreted by me today and showed an AHI of 0.5/hr on  14 cm H2O with 93% compliance in using more than 4 hours nightly.  The patient has been using and benefiting from PAP use and will continue to benefit from therapy.  ?-I will order a new ResMed CPAP at 14cm H2O with heated humidity and mask of choice ?-he will need a followup OV in 6 weeks after he gets his device per insurance requirements ? ?2.  Chest pain ?-he is having episodes of chest pain that is somewhat atypical.  It is nonexertional but radiates into his jaw.  He has some GERD and is not sure that it is not reflux but is getting more frequent.   ?-order coronary CTA to define coronary anatomy ?-he has had multiple CTs in the past with contrast but the last time had hives>>will prophylax with Prednisone, Pepcid and Benadryl  ?-check BMET for CTA ? ?3.  Headache ?-this occurs on occasion during intercourse ?-I am going to get him into his PCP ? ? ?Time Spent: ?20 minutes total time of encounter, including 15 minutes spent in face-to-face patient care on the date of this encounter. This time includes coordination of care and counseling regarding above mentioned problem list. Remainder of non-face-to-face time involved reviewing chart documents/testing relevant to the patient encounter and documentation in the medical record. I have independently reviewed documentation from referring provider ? ?Medication Adjustments/Labs and Tests Ordered: ?Current medicines are reviewed at length with the patient today.  Concerns regarding medicines are outlined above.  Medication changes, Labs and Tests ordered today are listed in the Patient Instructions below. ? ?There are no Patient Instructions on file for this visit. ? ? ?Signed, ?Armanda Magic, MD  ?01/21/2022 9:13 AM    ?[facility] ?[address] [place], [place], [place]  [address] ?Phone: [phone]; Fax:  [phone]  ? ?

## 2022-01-21 NOTE — Addendum Note (Signed)
Addended by: Theresia Majors on: 01/21/2022 09:27 AM ? ? Modules accepted: Orders ? ?

## 2022-01-21 NOTE — Patient Instructions (Addendum)
Medication Instructions:  ?Your physician recommends that you continue on your current medications as directed. Please refer to the Current Medication list given to you today. ? ?*If you need a refill on your cardiac medications before your next appointment, please call your pharmacy* ? ?Lab Work: ?TODAY: BMET ?If you have labs (blood work) drawn today and your tests are completely normal, you will receive your results only by: ?MyChart Message (if you have MyChart) OR ?A paper copy in the mail ?If you have any lab test that is abnormal or we need to change your treatment, we will call you to review the results. ? ?Testing/Procedures: ?Your provider has recommended that you have a coronary CTA. Please see below for further instructions.  ? ?Follow-Up: ?At [facility], you and your health needs are our priority.  As part of our continuing mission to provide you with exceptional heart care, we have created designated Provider Care Teams.  These Care Teams include your primary Cardiologist (physician) and Advanced Practice Providers (APPs -  Physician Assistants and Nurse Practitioners) who all work together to provide you with the care you need, when you need it. ? ?Your next appointment:   ?6 weeks after receiving your new device ? ?The format for your next appointment:   ?In Person or Virtual ? ?Provider:   ?Armanda Magic, MD ? ?Other Instructions ?Dr. Mayford Knife has ordered you a new ResMed CPAP.  ? ? ? ? ?Your cardiac CT will be scheduled at one of the below locations:  ? ?[facility] ?[address] ?[place], [place] [address] ?[phone] ? ?If scheduled at [facility], please arrive at the [facility] and Children's Entrance (Entrance C2) of [facility] 30 minutes prior to test start time. ?You can use the FREE valet parking offered at entrance C (encouraged to control the heart rate for the test)  ?Proceed to the [facility] Radiology Department (first floor) to check-in and test  prep. ? ?All radiology patients and guests should use entrance C2 at [facility], accessed from [facility], even though the hospital's physical address listed is [address]. ? ? ? ? ?Please follow these instructions carefully (unless otherwise directed): ? ?Hold all erectile dysfunction medications at least 3 days (72 hrs) prior to test. ? ?On the Night Before the Test: ?Be sure to Drink plenty of water. ?Do not consume any caffeinated/decaffeinated beverages or chocolate 12 hours prior to your test. ?Do not take any antihistamines 12 hours prior to your test. ?If the patient has contrast allergy: ?Patient will need a prescription for Prednisone and very clear instructions (as follows): ?Prednisone 50 mg - take 13 hours prior to test ?Take another Prednisone 50 mg 7 hours prior to test ?Take another Prednisone 50 mg 1 hour prior to test ?Take Benadryl 50 mg 1 hour prior to test ?Patient must complete all four doses of above prophylactic medications. ?Patient will need a ride after test due to Benadryl. ? ?On the Day of the Test: ?Drink plenty of water until 1 hour prior to the test. ?Do not eat any food 4 hours prior to the test. ?You may take your regular medications prior to the test.  ?Take metoprolol (Lopressor) two hours prior to test. ? ?After the Test: ?Drink plenty of water. ?After receiving IV contrast, you may experience a mild flushed feeling. This is normal. ?On occasion, you may experience a mild rash up to 24 hours after the test. This is not  dangerous. If this occurs, you can take Benadryl 25 mg and increase your fluid intake. ?If you experience trouble breathing, this can be serious. If it is severe call 911 IMMEDIATELY. If it is mild, please call our office. ?If you take any of these medications: Glipizide/Metformin, Avandament, Glucavance, please do not take 48 hours after completing test unless otherwise instructed. ? ?We will call to schedule your test 2-4 weeks  out understanding that some insurance companies will need an authorization prior to the service being performed.  ? ?For non-scheduling related questions, please contact the cardiac imaging nurse navigator should you have any questions/concerns: ?Rockwell Alexandria, Cardiac Imaging Nurse Navigator ?Larey Brick, Cardiac Imaging Nurse Navigator ?[facility] ?Direct Office Dial: [phone]  ? ?For scheduling needs, including cancellations and rescheduling, please call [place], [phone].   ?

## 2022-01-22 ENCOUNTER — Telehealth: Payer: Self-pay | Admitting: *Deleted

## 2022-01-22 DIAGNOSIS — G4733 Obstructive sleep apnea (adult) (pediatric): Secondary | ICD-10-CM

## 2022-01-22 NOTE — Telephone Encounter (Signed)
-----   Message from Antonieta Iba, RN sent at 01/21/2022  9:58 AM EST ----- ?Per Dr. Radford Pax:  ?Please order a new ResMed CPAP at 14cm H2O with heated humidity and mask of choice.  ?Follow up 6 weeks after receiving new device.  ?Thanks! ? ?

## 2022-01-22 NOTE — Telephone Encounter (Signed)
Order placed to [facility] via community message. 

## 2022-01-22 NOTE — Telephone Encounter (Signed)
order a new ResMed CPAP at 14cm H2O with heated humidity and mask of choice. ?Follow up 6 weeks after receiving new device.  ? ? ?Upon patient request DME selection is [facility] ?Patient understands he will be contacted by [facility] to set up his cpap. ?Patient understands to call if [facility] does not contact him with new setup in a timely manner. ?Patient understands they will be called once confirmation has been received from [facility] that they have received their new machine to schedule 10 week follow up appointment. ?  ?[facility] notified of new cpap order  ?Please add to airview ?Patient was grateful for the call and thanked me.  ? ? ? ? ? ? ? ? ? ? ? ? ? ?

## 2022-02-02 ENCOUNTER — Telehealth (HOSPITAL_COMMUNITY): Payer: Self-pay | Admitting: Emergency Medicine

## 2022-02-02 NOTE — Telephone Encounter (Signed)
Reaching out to patient to offer assistance regarding upcoming cardiac imaging study; pt verbalizes understanding of appt date/time, parking situation and where to check in, pre-test NPO status and medications ordered, and verified current allergies; name and call back number provided for further questions should they arise ?Rockwell Alexandria RN Navigator Cardiac Imaging ?[facility] ?[phone] office ?[phone] cell ? ? 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 ?

## 2022-02-04 ENCOUNTER — Ambulatory Visit (HOSPITAL_COMMUNITY)
Admission: RE | Admit: 2022-02-04 | Discharge: 2022-02-04 | Disposition: A | Discharge status: Home / Self Care | Payer: Commercial Managed Care - PPO | Source: Ambulatory Visit | Attending: Cardiology | Admitting: Cardiology

## 2022-02-04 ENCOUNTER — Encounter (HOSPITAL_COMMUNITY): Payer: Self-pay

## 2022-02-04 ENCOUNTER — Other Ambulatory Visit: Payer: Self-pay

## 2022-02-04 DIAGNOSIS — R072 Precordial pain: Secondary | ICD-10-CM

## 2022-02-04 MED ORDER — IOHEXOL 350 MG/ML SOLN
100.0000 mL | Freq: Once | INTRAVENOUS | Status: AC | PRN
  Administered 2022-02-04: 100 mL via INTRAVENOUS

## 2022-02-04 MED ORDER — NITROGLYCERIN 0.4 MG SL SUBL
SUBLINGUAL_TABLET | SUBLINGUAL | Status: AC
  Administered 2022-02-04: 0.8 mg via SUBLINGUAL
  Filled 2022-02-04: qty 1

## 2022-02-04 MED ORDER — DILTIAZEM HCL 25 MG/5ML IV SOLN
INTRAVENOUS | Status: AC
  Administered 2022-02-04: 10 mg via INTRAVENOUS
  Filled 2022-02-04: qty 5

## 2022-02-04 MED ORDER — NITROGLYCERIN 0.4 MG SL SUBL: 0.8000 mg | SUBLINGUAL_TABLET | Freq: Once | SUBLINGUAL | Status: AC

## 2022-02-04 MED ORDER — DILTIAZEM HCL 25 MG/5ML IV SOLN: 10.0000 mg | Freq: Once | INTRAVENOUS | Status: AC

## 2022-02-04 NOTE — Progress Notes (Signed)
Heart rate initially in low hundreds and occasionly would brady into 70's with breath hold. Did take his Metoprolol 100mg  2 hrs prior to test. Diltiazem 10 mg was given and heart rate down to 80-85.about 2 minutes later. Taken to scanner and plan to give more Diltiazem on table. After nitro BP dropped to SBP  92. Dr Gasper Sells notified. To scan patient retropective. Feels fine post test, SBP 104. Discharged walking. ?

## 2022-02-05 ENCOUNTER — Encounter: Payer: Self-pay | Admitting: Cardiology

## 2022-02-05 ENCOUNTER — Telehealth: Payer: Self-pay

## 2022-02-05 DIAGNOSIS — I2510 Atherosclerotic heart disease of native coronary artery without angina pectoris: Secondary | ICD-10-CM | POA: Insufficient documentation

## 2022-02-05 DIAGNOSIS — R079 Chest pain, unspecified: Secondary | ICD-10-CM

## 2022-02-05 NOTE — Telephone Encounter (Signed)
Patient will have labs done at his PCP and have them sent to Dr. Mayford Knife.  ?

## 2022-02-05 NOTE — Telephone Encounter (Signed)
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  Please have him come in for fasting lipid panel and ALT.  Continue aspirin.  Follow-up with me in 1 year ?

## 2022-02-05 NOTE — Telephone Encounter (Signed)
The patient has been notified of the result and verbalized understanding.  All questions (if any) were answered. ?Antonieta Iba, RN 02/05/2022 1:41 PM  ?Patient reports that he still does have some chest pain that comes and goes. He states that he has been having a lot of trouble with his acid reflux recently though and is not sure if that is the cause.  ?

## 2022-03-05 ENCOUNTER — Other Ambulatory Visit (HOSPITAL_COMMUNITY): Payer: Self-pay | Admitting: Physician Assistant

## 2022-03-05 ENCOUNTER — Other Ambulatory Visit: Payer: Self-pay | Admitting: Physician Assistant

## 2022-03-05 DIAGNOSIS — R140 Abdominal distension (gaseous): Secondary | ICD-10-CM

## 2022-03-05 DIAGNOSIS — R109 Unspecified abdominal pain: Secondary | ICD-10-CM

## 2022-03-11 ENCOUNTER — Telehealth: Payer: Self-pay | Admitting: Cardiology

## 2022-03-11 DIAGNOSIS — I2510 Atherosclerotic heart disease of native coronary artery without angina pectoris: Secondary | ICD-10-CM

## 2022-03-11 NOTE — Telephone Encounter (Signed)
New Message: ? ? ? ? ? ?Patient wants Dr Mayford Knife to know that the lab work she had wanted him to have , it is completed. He said he had the test at his [facility]. He said she can review the results on My Chart.  ?

## 2022-03-13 NOTE — Telephone Encounter (Signed)
Spoke with the patient and advised him that Dr. Mayford Knife has referred him to lipid clinic. Patient verbalized understanding.  ?

## 2022-03-13 NOTE — Telephone Encounter (Signed)
Spoke with the patient who reports that he was fasting for his lab work.  ?

## 2022-03-16 ENCOUNTER — Ambulatory Visit (HOSPITAL_COMMUNITY): Payer: Commercial Managed Care - PPO

## 2022-03-16 ENCOUNTER — Encounter (HOSPITAL_BASED_OUTPATIENT_CLINIC_OR_DEPARTMENT_OTHER): Payer: Self-pay

## 2022-03-16 ENCOUNTER — Ambulatory Visit (HOSPITAL_BASED_OUTPATIENT_CLINIC_OR_DEPARTMENT_OTHER)
Admission: RE | Admit: 2022-03-16 | Discharge: 2022-03-16 | Disposition: A | Discharge status: Home / Self Care | Payer: Commercial Managed Care - PPO | Source: Ambulatory Visit | Attending: Physician Assistant | Admitting: Physician Assistant

## 2022-03-16 DIAGNOSIS — R109 Unspecified abdominal pain: Secondary | ICD-10-CM | POA: Insufficient documentation

## 2022-03-16 DIAGNOSIS — R140 Abdominal distension (gaseous): Secondary | ICD-10-CM | POA: Diagnosis present

## 2022-03-16 MED ORDER — IOHEXOL 300 MG/ML  SOLN
100.0000 mL | Freq: Once | INTRAMUSCULAR | Status: AC | PRN
  Administered 2022-03-16: 80 mL via INTRAVENOUS

## 2022-03-26 DIAGNOSIS — K432 Incisional hernia without obstruction or gangrene: Secondary | ICD-10-CM | POA: Insufficient documentation

## 2022-03-31 DIAGNOSIS — E349 Endocrine disorder, unspecified: Secondary | ICD-10-CM | POA: Insufficient documentation

## 2022-03-31 DIAGNOSIS — E669 Obesity, unspecified: Secondary | ICD-10-CM | POA: Insufficient documentation

## 2022-03-31 DIAGNOSIS — E119 Type 2 diabetes mellitus without complications: Secondary | ICD-10-CM | POA: Insufficient documentation

## 2022-04-16 ENCOUNTER — Telehealth (INDEPENDENT_AMBULATORY_CARE_PROVIDER_SITE_OTHER): Payer: Commercial Managed Care - PPO | Admitting: Cardiology

## 2022-04-16 ENCOUNTER — Telehealth: Payer: Self-pay

## 2022-04-16 ENCOUNTER — Encounter: Payer: Self-pay | Admitting: Cardiology

## 2022-04-16 VITALS — Ht 72.0 in | Wt 246.0 lb

## 2022-04-16 DIAGNOSIS — I2510 Atherosclerotic heart disease of native coronary artery without angina pectoris: Secondary | ICD-10-CM

## 2022-04-16 DIAGNOSIS — E7800 Pure hypercholesterolemia, unspecified: Secondary | ICD-10-CM

## 2022-04-16 DIAGNOSIS — G4733 Obstructive sleep apnea (adult) (pediatric): Secondary | ICD-10-CM

## 2022-04-16 MED ORDER — ROSUVASTATIN CALCIUM 10 MG PO TABS: 10.0000 mg | ORAL_TABLET | Freq: Every day | ORAL | 3 refills | Status: DC

## 2022-04-16 NOTE — Telephone Encounter (Signed)
  Patient Consent for Virtual Visit        Jorge [name] has provided verbal consent on 04/16/2022 for a virtual visit (video or telephone).   CONSENT FOR VIRTUAL VISIT FOR:  Jorge [name]  By participating in this virtual visit I agree to the following:  I hereby voluntarily request, consent and authorize [facility] and its employed or contracted physicians, physician assistants, nurse practitioners or other licensed health care professionals (the Practitioner), to provide me with telemedicine health care services (the "Services") as deemed necessary by the treating Practitioner. I acknowledge and consent to receive the Services by the Practitioner via telemedicine. I understand that the telemedicine visit will involve communicating with the Practitioner through live audiovisual communication technology and the disclosure of certain medical information by electronic transmission. I acknowledge that I have been given the opportunity to request an in-person assessment or other available alternative prior to the telemedicine visit and am voluntarily participating in the telemedicine visit.  I understand that I have the right to withhold or withdraw my consent to the use of telemedicine in the course of my care at any time, without affecting my right to future care or treatment, and that the Practitioner or I may terminate the telemedicine visit at any time. I understand that I have the right to inspect all information obtained and/or recorded in the course of the telemedicine visit and may receive copies of available information for a reasonable fee.  I understand that some of the potential risks of receiving the Services via telemedicine include:  Delay or interruption in medical evaluation due to technological equipment failure or disruption; Information transmitted may not be sufficient (e.g. poor resolution of images) to allow for appropriate medical decision making by the Practitioner; and/or   In rare instances, security protocols could fail, causing a breach of personal health information.  Furthermore, I acknowledge that it is my responsibility to provide information about my medical history, conditions and care that is complete and accurate to the best of my ability. I acknowledge that Practitioner's advice, recommendations, and/or decision may be based on factors not within their control, such as incomplete or inaccurate data provided by me or distortions of diagnostic images or specimens that may result from electronic transmissions. I understand that the practice of medicine is not an exact science and that Practitioner makes no warranties or guarantees regarding treatment outcomes. I acknowledge that a copy of this consent can be made available to me via my patient portal ([facility]), or I can request a printed copy by calling the office of [facility].    I understand that my insurance will be billed for this visit.   I have read or had this consent read to me. I understand the contents of this consent, which adequately explains the benefits and risks of the Services being provided via telemedicine.  I have been provided ample opportunity to ask questions regarding this consent and the Services and have had my questions answered to my satisfaction. I give my informed consent for the services to be provided through the use of telemedicine in my medical care

## 2022-04-16 NOTE — Patient Instructions (Addendum)
Medication Instructions:  Your physician has recommended you make the following change in your medication:  1) Crestor (rosuvastatin) 10 mg daily   *If you need a refill on your cardiac medications before your next appointment, please call your pharmacy*  Lab Work: IN 6 WEEKS: Fasting lipids and ALT (week of July 10th) If you have labs (blood work) drawn today and your tests are completely normal, you will receive your results only by: MyChart Message (if you have MyChart) OR A paper copy in the mail If you have any lab test that is abnormal or we need to change your treatment, we will call you to review the results.  Follow-Up: At [facility], you and your health needs are our priority.  As part of our continuing mission to provide you with exceptional heart care, we have created designated Provider Care Teams.  These Care Teams include your primary Cardiologist (physician) and Advanced Practice Providers (APPs -  Physician Assistants and Nurse Practitioners) who all work together to provide you with the care you need, when you need it.  Your next appointment:   1 year(s)  The format for your next appointment:   In Person  Provider:   Armanda Magic, MD    Important Information About [facility]

## 2022-04-16 NOTE — Telephone Encounter (Signed)
  Patient Consent for Virtual Visit        Jorge [name] has provided verbal consent on 04/16/2022 for a virtual visit (video or telephone).   CONSENT FOR VIRTUAL VISIT FOR:  Jorge [name]  By participating in this virtual visit I agree to the following:  I hereby voluntarily request, consent and authorize [facility] and its employed or contracted physicians, physician assistants, nurse practitioners or other licensed health care professionals (the Practitioner), to provide me with telemedicine health care services (the "Services") as deemed necessary by the treating Practitioner. I acknowledge and consent to receive the Services by the Practitioner via telemedicine. I understand that the telemedicine visit will involve communicating with the Practitioner through live audiovisual communication technology and the disclosure of certain medical information by electronic transmission. I acknowledge that I have been given the opportunity to request an in-person assessment or other available alternative prior to the telemedicine visit and am voluntarily participating in the telemedicine visit.  I understand that I have the right to withhold or withdraw my consent to the use of telemedicine in the course of my care at any time, without affecting my right to future care or treatment, and that the Practitioner or I may terminate the telemedicine visit at any time. I understand that I have the right to inspect all information obtained and/or recorded in the course of the telemedicine visit and may receive copies of available information for a reasonable fee.  I understand that some of the potential risks of receiving the Services via telemedicine include:  Delay or interruption in medical evaluation due to technological equipment failure or disruption; Information transmitted may not be sufficient (e.g. poor resolution of images) to allow for appropriate medical decision making by the Practitioner; and/or   In rare instances, security protocols could fail, causing a breach of personal health information.  Furthermore, I acknowledge that it is my responsibility to provide information about my medical history, conditions and care that is complete and accurate to the best of my ability. I acknowledge that Practitioner's advice, recommendations, and/or decision may be based on factors not within their control, such as incomplete or inaccurate data provided by me or distortions of diagnostic images or specimens that may result from electronic transmissions. I understand that the practice of medicine is not an exact science and that Practitioner makes no warranties or guarantees regarding treatment outcomes. I acknowledge that a copy of this consent can be made available to me via my patient portal ([facility]), or I can request a printed copy by calling the office of [facility].    I understand that my insurance will be billed for this visit.   I have read or had this consent read to me. I understand the contents of this consent, which adequately explains the benefits and risks of the Services being provided via telemedicine.  I have been provided ample opportunity to ask questions regarding this consent and the Services and have had my questions answered to my satisfaction. I give my informed consent for the services to be provided through the use of telemedicine in my medical care    

## 2022-04-16 NOTE — Progress Notes (Unsigned)
Virtual Visit via Video Note   Because of Jorge [name]'s co-morbid illnesses, he is at least at moderate risk for complications without adequate follow up.  This format is felt to be most appropriate for this patient at this time.  All issues noted in this document were discussed and addressed.  A limited physical exam was performed with this format.  Please refer to the patient's chart for his consent to telehealth [facility].   Date:  04/16/2022   ID:  Jorge BreslowBruce [name], DOB 09/22/1971, MRN [id number] The patient was identified using 2 identifiers.  Patient Location: Home Provider Location: Office/Clinic   PCP:  Warden FillersLambeth, John, MD   [facility] Providers Cardiologist:  None Sleep Medicine:  Armanda Magicraci Kahron Kauth, MD     Evaluation Performed:  Follow-Up Visit  Chief Complaint:  OSA  History of Present Illness:    Jorge [name] is a 51 y.o. male with  a hx of obstructive sleep apnea on CPAP. His OSA was initially dx in 2010 with sleep study showing moderate OSA with an AHI of 21/hr but increased to 68/hr during REM sleep.  He was placed on CPAP at 14cm H2O.  He was lost to followup and presented back in March to reestablish care for his sleep apnea.  During that office visit he also complained of some atypical chest pain as well as severe headaches at the time of climax with having intercourse with his wife.  He underwent coronary CTA in March which showed a coronary calcium score of 0 with mild soft nonobstructive plaque in the mid RCA, mid LAD of 25 to 49%.  He was started on ResMed CPAP at 14 cm H2O as his device was old.  He is here today for followup and is doing well.  He denies any chest pain or pressure, SOB, DOE, PND, orthopnea, LE edema, dizziness, palpitations or syncope. He is compliant with his meds and is tolerating meds with no SE.     He is doing well with his CPAP device and thinks that he has gotten used to it.  He tolerates the mask and feels the pressure is  adequate.  Since going on CPAP he feels rested in the am and has no significant daytime sleepiness.  He denies any significant mouth or nasal dryness or nasal congestion.  He does not think that he snores.      Past Medical History:  Diagnosis Date   CAD (coronary artery disease), native coronary artery    Coronary CTA showed mild nonobstructive CAD 25 to 49% stenosis in the mid RCA.   Diabetes mellitus type 2, controlled (HCC)    GERD (gastroesophageal reflux disease) 04/05/2012   Hiatal hernia 04/05/2012   Hyperlipidemia 12/01/2012   HE PREFERS TO USE NIACIN AND BRAN TO LOWER HIS TRIGLYCERIDES AND CHOLESTEROL   Obesity    OSA on CPAP 04/05/2012   Testosterone deficiency    Past Surgical History:  Procedure Laterality Date   CHOLECYSTECTOMY     FINGER SURGERY     HERNIA REPAIR     LEG SURGERY Right    WISDOM TOOTH EXTRACTION       Current Meds  Medication Sig   Semaglutide (OZEMPIC, 1 MG/DOSE, [place]) Inject into the skin once a week.   sildenafil (REVATIO) 20 MG tablet Take 20 mg by mouth as needed.   SYRINGE-NEEDLE, DISP, 3 ML 23G X 1" 3 ML MISC 1 each by Does not apply route. 1 EACH BY DOES NOT APPLY ROUTE  EVERY 10 DAYS   testosterone cypionate (DEPOTESTOSTERONE CYPIONATE) 200 MG/ML injection Inject 200 mg into the muscle. INJECT 1 ML (200 MG TOTAL) INTO THE MUSCLE EVERY 10 DAYS   TRESIBA FLEXTOUCH 200 UNIT/ML FlexTouch Pen Unit(s) SUB-Q Daily     Allergies:   Atorvastatin, Iodinated contrast media, and Other   Social History   Tobacco Use   Smoking status: Former    Types: Cigarettes    Quit date: 09/22/2012    Years since quitting: 9.5   Smokeless tobacco: Never  Vaping Use   Vaping Use: Never used  Substance Use Topics   Alcohol use: No   Drug use: No     Family Hx: The patient's family history includes Alzheimer's disease in his mother; Valvular heart disease in his brother and father.  ROS:   Please see the history of present illness.     All other systems  reviewed and are negative.   Prior CV studies:   The following studies were reviewed today:  Coronary CTA and PAP compliance download  Labs/Other Tests and Data Reviewed:    EKG:  No ECG reviewed.  Recent Labs: 01/21/2022: BUN 15; Creatinine, Ser 0.86; Potassium 4.5; Sodium 139   Recent Lipid Panel No results found for: CHOL, TRIG, HDL, CHOLHDL, LDLCALC, LDLDIRECT  Wt Readings from Last 3 Encounters:  04/16/22 246 lb (111.6 kg)  01/21/22 253 lb 6.4 oz (114.9 kg)  05/11/18 243 lb 3.2 oz (110.3 kg)     Risk Assessment/Calculations:          Objective:    Vital Signs:  Ht 6' (1.829 m)   Wt 246 lb (111.6 kg)   BMI 33.36 kg/m    VITAL SIGNS:  reviewed GEN:  no acute distress EYES:  sclerae anicteric, EOMI - Extraocular Movements Intact RESPIRATORY:  normal respiratory effort, symmetric expansion CARDIOVASCULAR:  no peripheral edema SKIN:  no rash, lesions or ulcers. MUSCULOSKELETAL:  no obvious deformities. NEURO:  alert and oriented x 3, no obvious focal deficit PSYCH:  normal affect  ASSESSMENT & PLAN:    OSA - The patient is tolerating PAP therapy well without any problems. The PAP download performed by his DME was personally reviewed and interpreted by me today and showed an AHI of 0.3 /hr on 14 cm H2O with 77% compliance in using more than 4 hours nightly.  The patient has been using and benefiting from PAP use and will continue to benefit from therapy.   2.  Chest pain/CAD -Coronary CTA showed mild soft nonobstructive plaque in the mid LAD and RCA with 25 to 49% stenosis -He has not had any further chest pain -Continue aspirin 81 mg daily  -start statin>>see below  3.  Hyperlipidemia -LDL goal less than 70 due to CAD  -I have personally reviewed and interpreted outside labs performed by patient's PCP which showed LDL 88, HDL was 37 and TAGs 266 -currently not on statin therapy as he has been intolerant to atorvastatin -BS have been elevated and likely related  to poorly controlled DM -start Crestor 10mg  daily -Check FLP and ALT in 6 weeks     Time:   Today, I have spent 15 minutes with the patient with telehealth technology discussing the above problems.     Medication Adjustments/Labs and Tests Ordered: Current medicines are reviewed at length with the patient today.  Concerns regarding medicines are outlined above.   Tests Ordered: No orders of the defined types were placed in this encounter.   Medication  Changes: No orders of the defined types were placed in this encounter.   Follow Up:  In Person in 1 year(s)  Signed, Armanda Magic, MD  04/16/2022 11:45 AM    [facility]

## 2022-08-18 ENCOUNTER — Telehealth: Payer: Self-pay | Admitting: Cardiology

## 2022-08-18 NOTE — Telephone Encounter (Signed)
Pt states that labs that Dr. Radford Pax ordered he had done by his Endocrinologist. Results can be found in his chart. Please advise

## 2022-08-19 NOTE — Telephone Encounter (Signed)
Spoke with the patient and advised him on recommendations from Dr. Radford Pax. Patient does not wish to see PharmD in lipid clinic at this time. He is working with his endocrinologist to get his sugars under control and would like to see if this helps brings his triglycerides down first.

## 2023-02-14 ENCOUNTER — Other Ambulatory Visit: Payer: Self-pay | Admitting: Cardiology

## 2023-03-05 ENCOUNTER — Other Ambulatory Visit: Payer: Self-pay | Admitting: Cardiology

## 2023-04-12 ENCOUNTER — Other Ambulatory Visit: Payer: Self-pay | Admitting: Cardiology

## 2023-04-12 DIAGNOSIS — I2510 Atherosclerotic heart disease of native coronary artery without angina pectoris: Secondary | ICD-10-CM

## 2023-04-12 DIAGNOSIS — E7800 Pure hypercholesterolemia, unspecified: Secondary | ICD-10-CM

## 2023-04-12 DIAGNOSIS — G4733 Obstructive sleep apnea (adult) (pediatric): Secondary | ICD-10-CM

## 2023-04-26 IMAGING — CT CT HEART MORP W/ CTA COR W/ SCORE W/ CA W/CM &/OR W/O CM
4 of 7 series · 8 of 20 positions shown, 9 images · IV contrast (APPLIED)
Comparison: None.
COMPARISON: None.

Addendum:
EXAM:
OVER-READ INTERPRETATION  CT CHEST

The following report is an over-read performed by radiologist Dr.
Manuel Bessa Lamusse [REDACTED] on 02/04/2022. This
over-read does not include interpretation of cardiac or coronary
anatomy or pathology. The coronary calcium score/coronary CTA
interpretation by the cardiologist is attached.
CLINICAL DATA: 50 Year old White Male
Cardiac/Coronary  CTA
TECHNIQUE: The patient was scanned on a [employer] Force scanner.

[Series 7: ts syst · axial · 0.39mm/px · z∈[-120,-82]mm · 2 of 289 slices shown, 3 images]
[im 97/289  vessel]
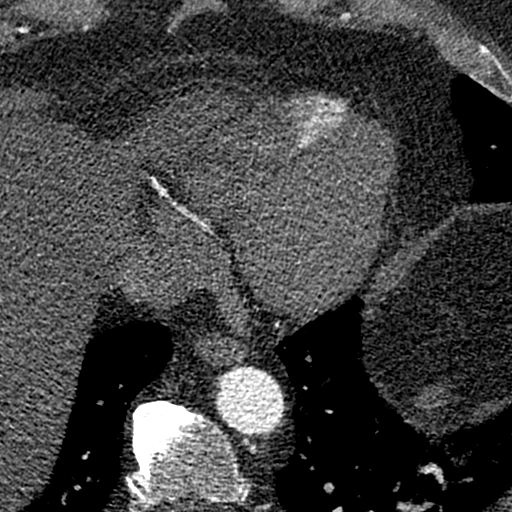
[im 97/289  lung]
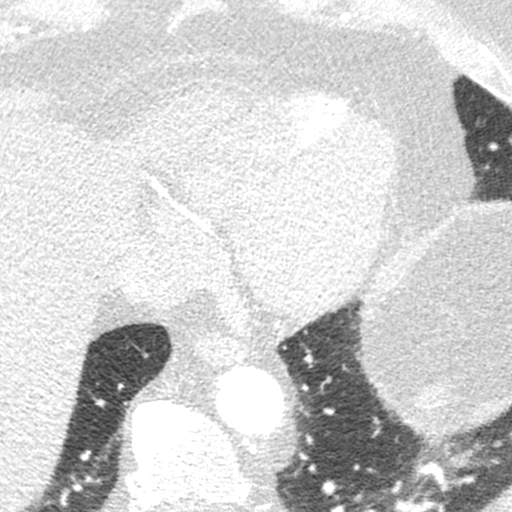
[im 193/289  vessel]
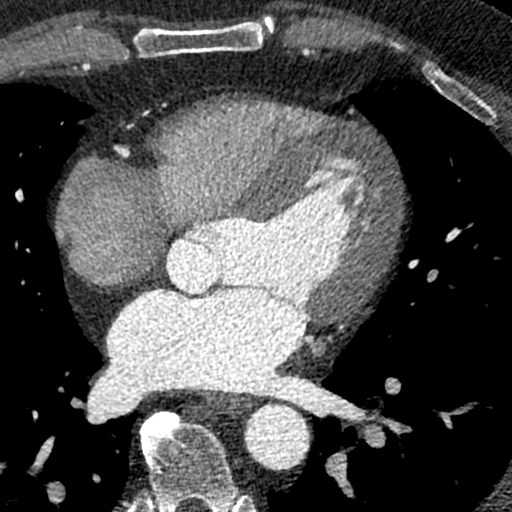

[Series 8: best diast · axial · 0.39mm/px · z∈[-120,-82]mm · 2 of 289 slices shown]
[im 97/289  vessel]
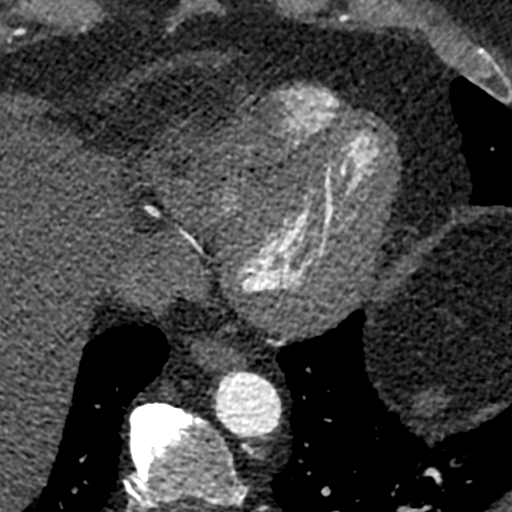
[im 193/289  vessel]
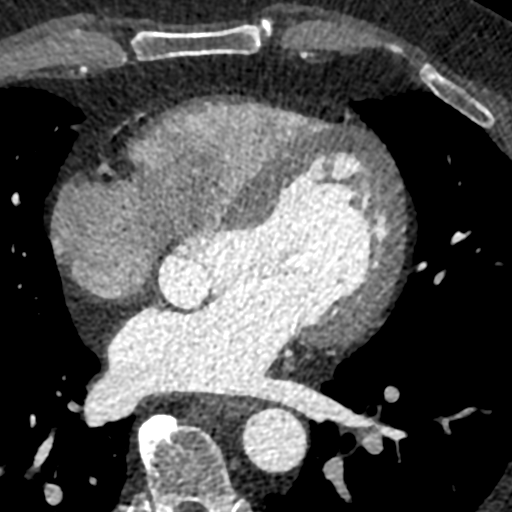

[Series 9: best syst · axial · 0.39mm/px · z∈[-120,-82]mm · 2 of 289 slices shown]
[im 97/289  vessel]
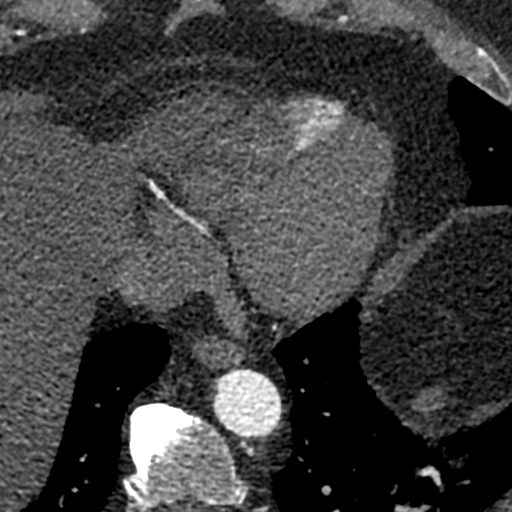
[im 193/289  vessel]
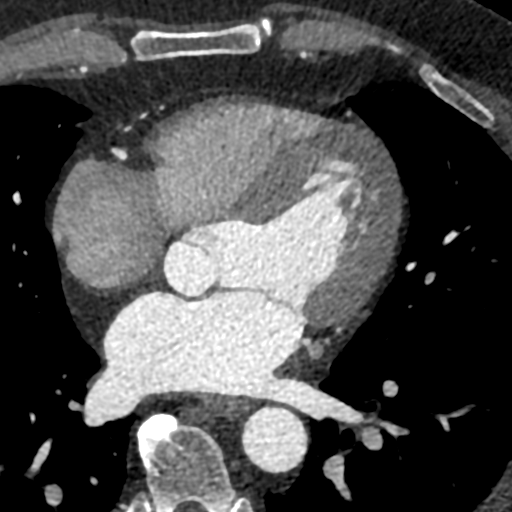

[Series 10: ts diast · axial · 0.39mm/px · z∈[-120,-82]mm · 2 of 289 slices shown]
[im 97/289  vessel]
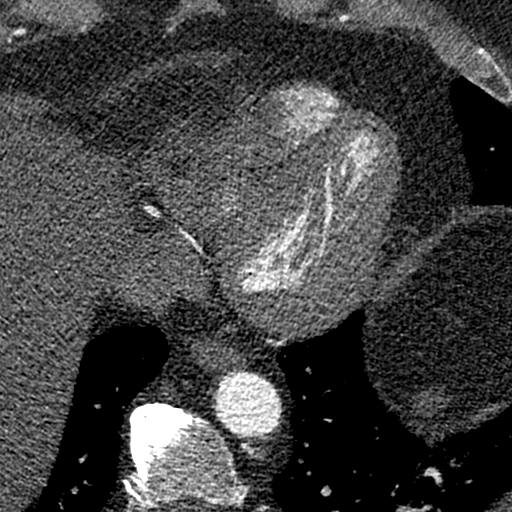
[im 193/289  vessel]
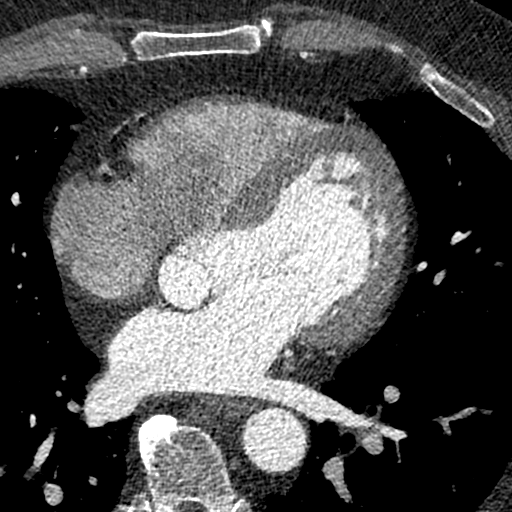

[8 of 20 positions shown; findings below may reference images not displayed]

FINDINGS: Vascular: No acute non-cardiac vascular finding.

Mediastinum/Nodes: Calcified mediastinal and left hilar lymph nodes.
No pathologically enlarged mediastinal, or hilar lymph nodes.
Visualized portions of the esophagus are grossly unremarkable

Lungs/Pleura: Hypoventilatory change in the lung bases. Calcified
granuloma in the left lower lobe. Within the visualized portions of
the thorax there are no suspicious appearing pulmonary nodules or
masses, there is no acute consolidative airspace disease, no pleural
effusions and no pneumothorax

Upper Abdomen: Visualized portions of the upper abdomen are
unremarkable.

Musculoskeletal: There are no aggressive appearing lytic or blastic
lesions noted in the visualized portions of the skeleton.
IMPRESSION: No significant acute incidental noncardiac finding noted.

Sequela of prior granulomatous disease.
FINDINGS: Scan was triggered in the descending thoracic aorta. Axial
non-contrast 3 mm slices were carried out through the heart. The
data set was analyzed on a dedicated work station and scored using
the Agatson method. Gantry rotation speed was 250 msecs and
collimation was .6 mm. 0.8 mg of sl NTG was given. The 3D data set
was reconstructed in 5% intervals of the 67-82 % of the R-R cycle.
Diastolic phases were analyzed on a dedicated work station using
MPR, MIP and VRT modes. The patient received 100 cc of contrast.

Aorta:  Normal size.  No calcifications.  No dissection.

Main Pulmonary Artery: Normal size of the pulmonary artery.

Aortic Valve:  Tri-leaflet.  No calcifications.

Coronary Arteries:  Normal coronary origin.  Right dominance.

Coronary Calcium Score:

Left main: 0

Left anterior descending artery: 0

Left circumflex artery: 0

Right coronary artery: 0

Ramus intermedius artery: 0

Total: 0

Percentile: 1st for age, sex, and race matched control.

RCA is a large dominant artery that gives rise to PDA and PLA. There
is mild soft plaque in the mid RCA.

Left main is a large artery that gives rise to LAD, RI, and LCX
arteries. There is no significant plaque.

LAD is a large vessel that gives rise to one two diagonal vessels.
There is a mild mid LAD and D1 narrowing without discrete stenosis.

LCX is a non-dominant artery that gives rise to one large OM1
branch. There is no significant plaque.

There is a ramus intermedius vessel with no significant plaque.

Other findings:

Normal pulmonary vein drainage into the left atrium.

Normal left atrial appendage without a thrombus.

Extra-cardiac findings: See attached radiology report for
non-cardiac structures.
IMPRESSION: 1. Coronary calcium score of 0. This was 1st percentile for age,
sex, and race matched control.

2. Normal coronary origin with right dominance.

3. CAD-RADS 2. Mild non-obstructive CAD (25-49%). Consider
non-atherosclerotic causes of chest pain. Consider preventive
therapy and risk factor modification.

RECOMMENDATIONS:



If CAC = 0, it is reasonable to withhold statin therapy and reassess
in 5 to 10 years, as long as higher risk conditions are absent
(diabetes mellitus, family history of premature CHD in first degree
relatives (males <55 years; females <65 years), cigarette smoking,
LDL >=190 mg/dL or other independent risk factors).

If CAC is 1 to 99, it is reasonable to initiate statin therapy for
patients >=55 years of age.

If CAC is >=100 or >=75th percentile, it is reasonable to initiate
statin therapy at any age.

Cardiology referral should be considered for patients with CAC
scores =400 or >=75th percentile.

*8291 AHA/ACC/AACVPR/AAPA/ABC/ARMAND/FENTAW/RIK/[place]/RILEY/LAAOUINA
Guideline on the Management of Blood Cholesterol: A Report of the
American College of Cardiology/American Heart Association Task Force
on Clinical Practice Guidelines. J Am Coll Cardiol.
6785;73(24):4614-4334.

*** End of Addendum ***
EXAM:
OVER-READ INTERPRETATION  CT CHEST

The following report is an over-read performed by radiologist Dr.
Manuel Bessa Lamusse [REDACTED] on 02/04/2022. This
over-read does not include interpretation of cardiac or coronary
anatomy or pathology. The coronary calcium score/coronary CTA
interpretation by the cardiologist is attached.
FINDINGS: Vascular: No acute non-cardiac vascular finding.

Mediastinum/Nodes: Calcified mediastinal and left hilar lymph nodes.
No pathologically enlarged mediastinal, or hilar lymph nodes.
Visualized portions of the esophagus are grossly unremarkable

Lungs/Pleura: Hypoventilatory change in the lung bases. Calcified
granuloma in the left lower lobe. Within the visualized portions of
the thorax there are no suspicious appearing pulmonary nodules or
masses, there is no acute consolidative airspace disease, no pleural
effusions and no pneumothorax

Upper Abdomen: Visualized portions of the upper abdomen are
unremarkable.

Musculoskeletal: There are no aggressive appearing lytic or blastic
lesions noted in the visualized portions of the skeleton.
IMPRESSION: No significant acute incidental noncardiac finding noted.

Sequela of prior granulomatous disease.

## 2023-06-05 IMAGING — CT CT ABD-PELV W/ CM
2 of 5 series · 15 of 46 positions shown, 17 images · IV contrast (APPLIED)
Comparison: None.

CLINICAL DATA: Abdominal pain

EXAM:
CT ABDOMEN AND PELVIS WITH CONTRAST
TECHNIQUE: Multidetector CT imaging of the abdomen and pelvis was performed
using the standard protocol following bolus administration of
intravenous contrast.

[Series 2: abd pel w · axial · 0.98mm/px · z∈[-40,+440]mm · 12 of 108 slices shown, 14 images]
[im 6/108  soft-tissue]
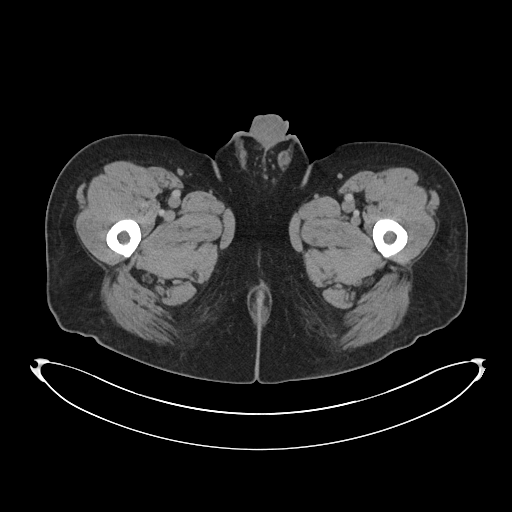
[im 6/108  bone]
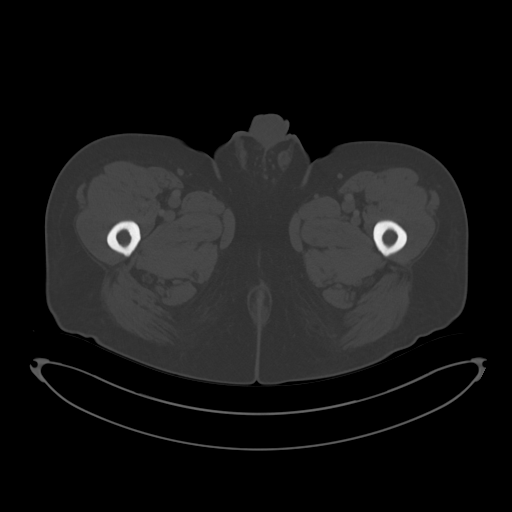
[im 17/108  soft-tissue]
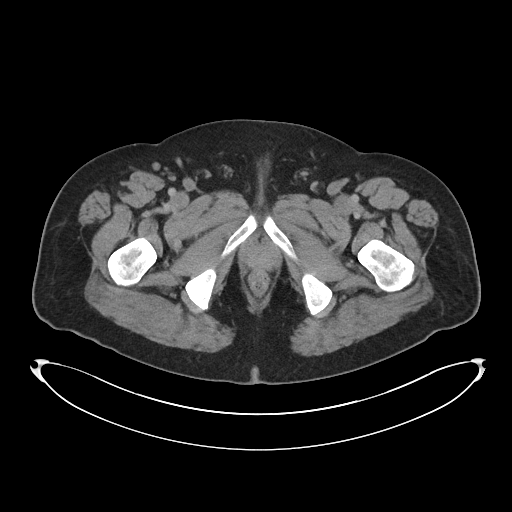
[im 23/108  soft-tissue]
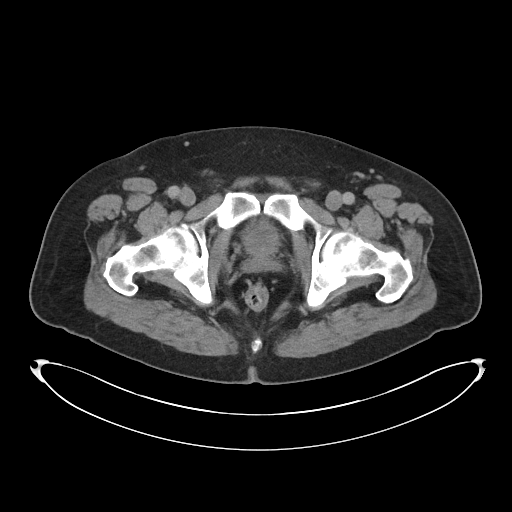
[im 34/108  soft-tissue]
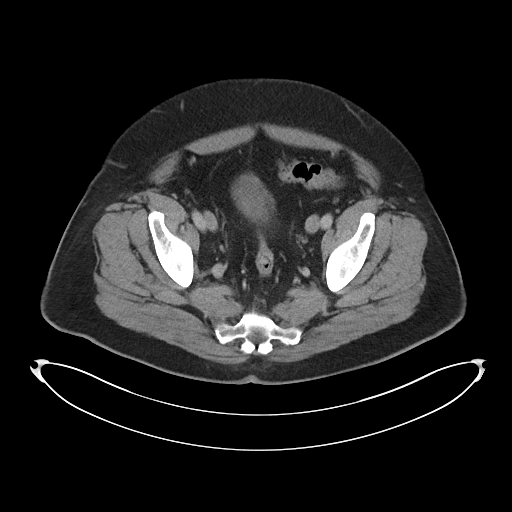
[im 40/108  soft-tissue]
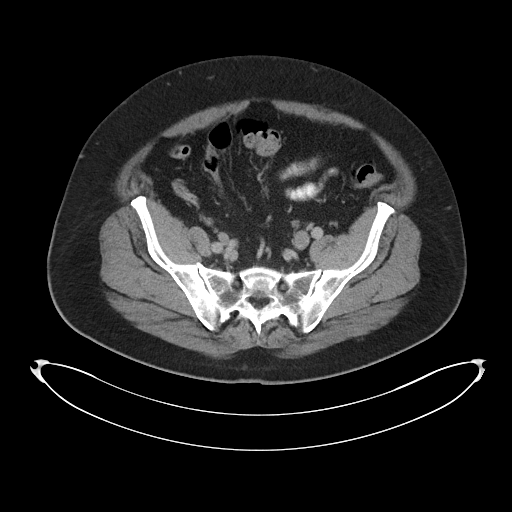
[im 51/108  soft-tissue]
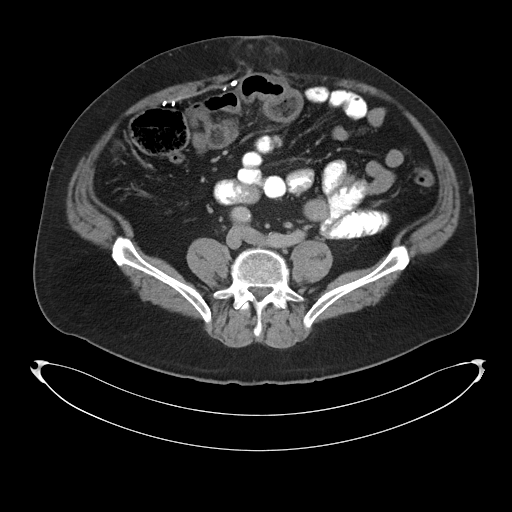
[im 57/108  soft-tissue]
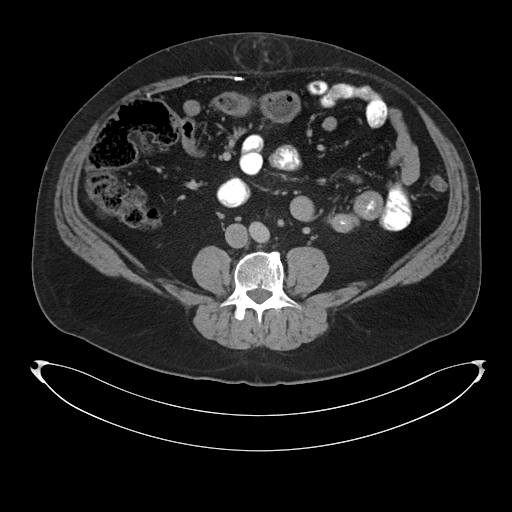
[im 68/108  soft-tissue]
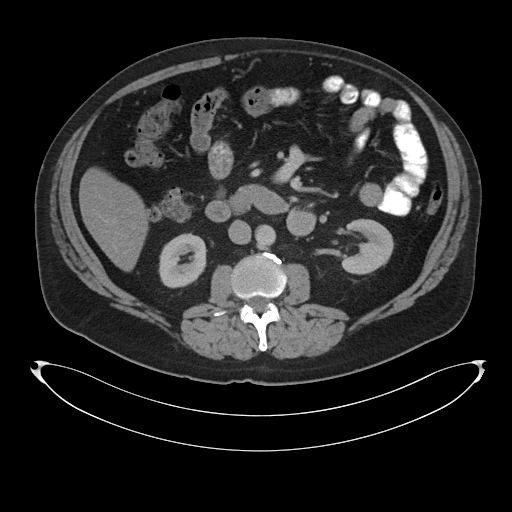
[im 74/108  soft-tissue]
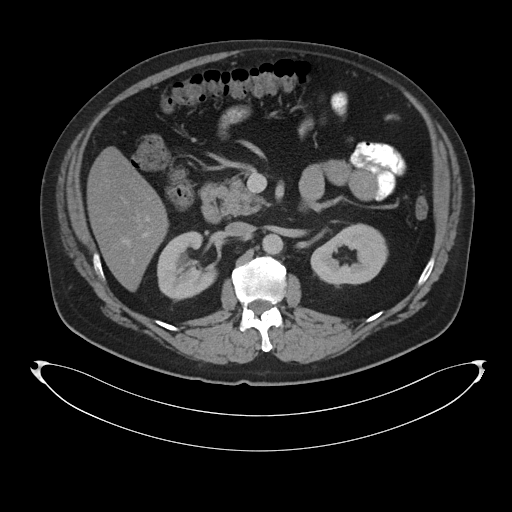
[im 74/108  bone]
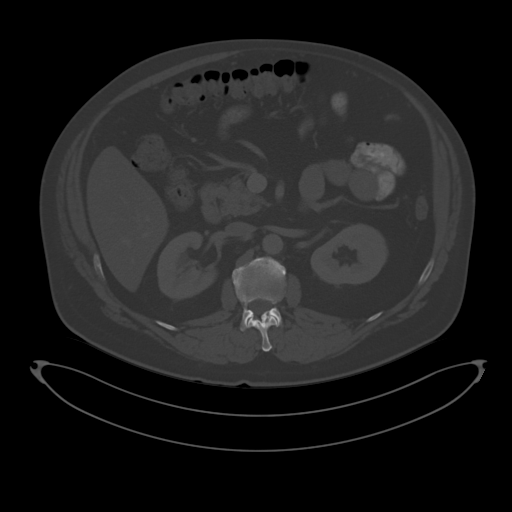
[im 85/108  soft-tissue]
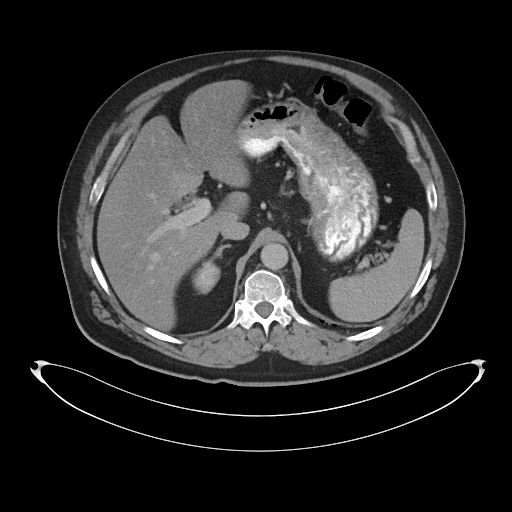
[im 91/108  soft-tissue]
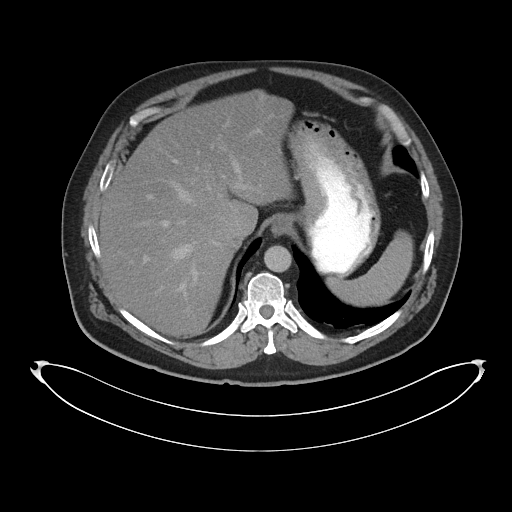
[im 102/108  soft-tissue]
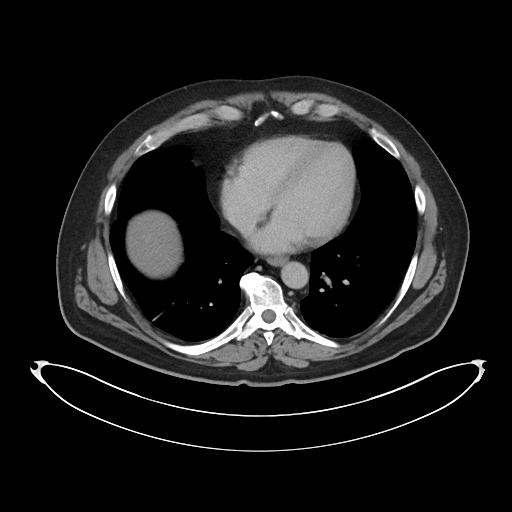

[Series 4: coronal · coronal · 0.87mm/px · 3 of 117 slices shown]
[im 39/117  soft-tissue]
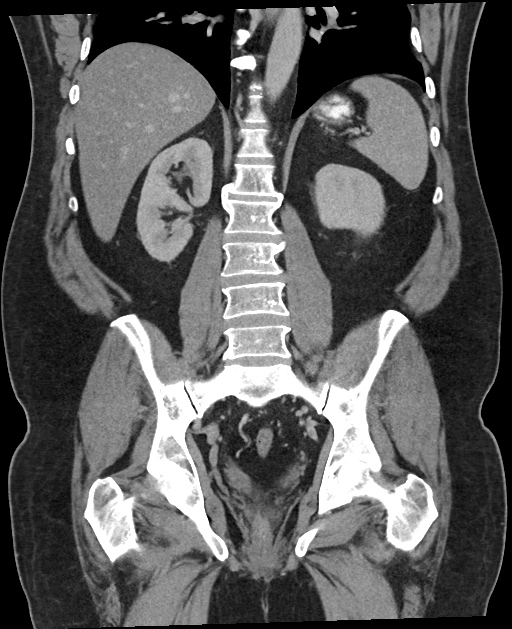
[im 52/117  soft-tissue]
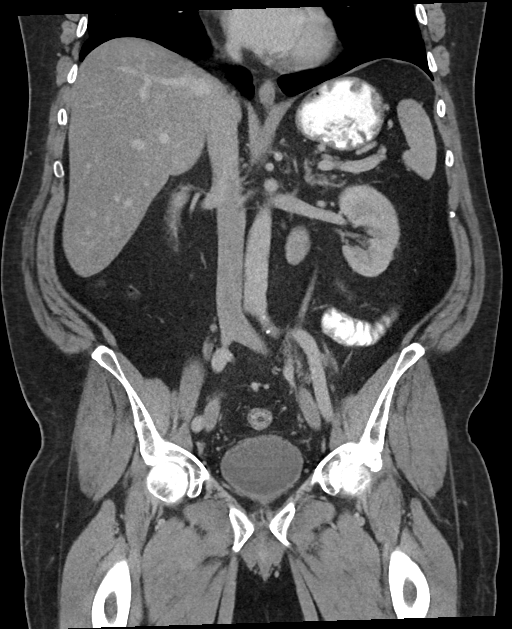
[im 65/117  soft-tissue]
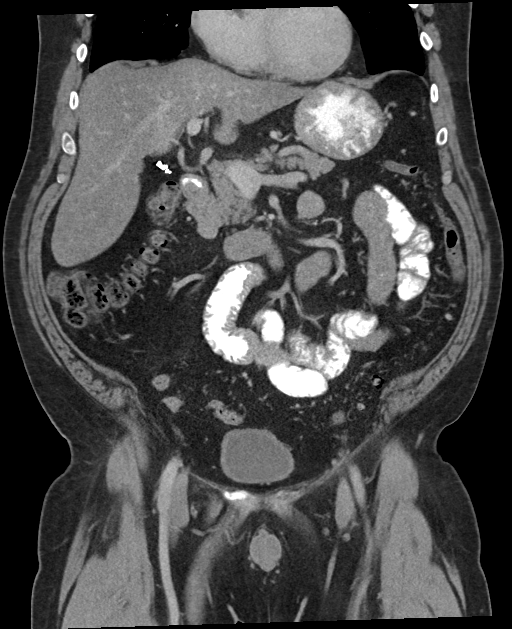

[15 of 46 positions shown; findings below may reference images not displayed]

RADIATION DOSE REDUCTION: This exam was performed according to the
departmental dose-optimization program which includes automated
exposure control, adjustment of the mA and/or kV according to
patient size and/or use of iterative reconstruction technique.

CONTRAST:  80mL OMNIPAQUE IOHEXOL 300 MG/ML  SOLN
FINDINGS: Lower chest: Linear densities in the lower lung fields suggest
scarring or subsegmental atelectasis. There is calcified granuloma
in the left lower lung fields.

Hepatobiliary: There is fatty infiltration in the liver. Liver
measures 21.5 cm in length. Surgical clips are seen in gallbladder
fossa. There is no dilation of bile ducts.

Pancreas: No focal abnormality is seen.

Spleen: There are small calcifications suggesting granulomas. Spleen
is not enlarged.

Adrenals/Urinary Tract: Adrenals are unremarkable. There is no
hydronephrosis. There are no renal or ureteral stones. Is 1.7 cm
smooth marginated lesion in the midportion of right kidney
suggesting renal cyst. Urinary bladder is unremarkable.

Stomach/Bowel: Oral contrast is seen in the stomach and small bowel
loops. There is no dilation of small-bowel loops. Appendix is
difficult to visualize. In the image 76 of coronal reconstruction
images there is a small caliber tubular structure adjacent to the
cecum, possibly normal appendix. There is no pericecal inflammation.
There is no significant wall thickening in colon. Multiple
diverticula are seen in the colon without signs of focal acute
diverticulitis.

Vascular/Lymphatic: Unremarkable.

Reproductive: There are coarse calcifications in the prostate.

Other: There is no ascites or pneumoperitoneum. There is evidence of
previous ventral hernia repair to the right of umbilicus. There is
moderate sized umbilical hernia containing fat. Hernial sac measures
6.4 cm in maximum diameter. Neck of the hernia measures 2.4 cm.

Musculoskeletal: Degenerative changes are noted in the lumbar spine
with anterior bony spurs, particularly prominent at L3-L4 level.
IMPRESSION: There is no evidence intestinal obstruction or pneumoperitoneum.
There is no hydronephrosis.

Enlarged fatty liver. Moderate sized hiatal hernia containing fat.
Scattered diverticula are seen in the colon without signs of focal
acute diverticulitis. Linear densities in the lower lung fields may
suggest scarring or subsegmental atelectasis.

Other findings as described in the body of the report.

## 2023-07-03 ENCOUNTER — Other Ambulatory Visit: Payer: Self-pay | Admitting: Cardiology

## 2023-07-28 ENCOUNTER — Other Ambulatory Visit: Payer: Self-pay | Admitting: Cardiology

## 2023-08-23 ENCOUNTER — Encounter: Payer: Self-pay | Admitting: Nurse Practitioner

## 2023-08-23 ENCOUNTER — Ambulatory Visit (INDEPENDENT_AMBULATORY_CARE_PROVIDER_SITE_OTHER): Payer: Commercial Managed Care - PPO | Admitting: Nurse Practitioner

## 2023-08-23 VITALS — BP 127/78 | HR 93 | Temp 97.5°F | Ht 72.0 in | Wt 257.0 lb

## 2023-08-23 DIAGNOSIS — Z7985 Long-term (current) use of injectable non-insulin antidiabetic drugs: Secondary | ICD-10-CM

## 2023-08-23 DIAGNOSIS — E119 Type 2 diabetes mellitus without complications: Secondary | ICD-10-CM

## 2023-08-23 DIAGNOSIS — Z0001 Encounter for general adult medical examination with abnormal findings: Secondary | ICD-10-CM

## 2023-08-23 DIAGNOSIS — Z7984 Long term (current) use of oral hypoglycemic drugs: Secondary | ICD-10-CM

## 2023-08-23 DIAGNOSIS — Z125 Encounter for screening for malignant neoplasm of prostate: Secondary | ICD-10-CM

## 2023-08-23 DIAGNOSIS — Z794 Long term (current) use of insulin: Secondary | ICD-10-CM

## 2023-08-23 NOTE — Progress Notes (Signed)
Established Patient Office Visit  Subjective   Patient ID: Jorge [name], male    DOB: 07/14/1971  Age: 52 y.o. MRN: [id number]  Chief Complaint  Patient presents with   Establish Care    HPI Jorge [name] with PMH of DM2, CAD, OSA, GERD here to establish care. He currently sees endocrine for his diabetes and wants to  continue seeing him. He currently on Insulin, Mounjaro, and metformin last A1c was 8.6 Family history of mitral valve prolapse he is current under the care of cardiology for CAD He has an upcoming  left knee  replacement surgery in January.  Smokes 1-pack every 4-5 months Last Colonoscopy was abnormal, he is schedule to repeat in January before his surgery.     08/23/2023    4:10 PM  PHQ9 SCORE ONLY  PHQ-9 Total Score 3    Patient Active Problem List   Diagnosis Date Noted   Encounter for general adult medical examination with abnormal findings 08/23/2023   Diabetes mellitus treated with insulin and oral medication (HCC) 08/23/2023   Screening PSA (prostate specific antigen) 08/23/2023   Long-term current use of injectable noninsulin antidiabetic medication 08/23/2023   Diabetes mellitus, type 2 (HCC) 03/31/2022   Obesity 03/31/2022   Testosterone deficiency 03/31/2022   Ventral incisional hernia 03/26/2022   CAD (coronary artery disease), native coronary artery 02/05/2022   OSA (obstructive sleep apnea) 09/22/2017   Hyperlipidemia 12/01/2012   GERD (gastroesophageal reflux disease) 04/05/2012   Hiatal hernia 04/05/2012   Past Medical History:  Diagnosis Date   CAD (coronary artery disease), native coronary artery    Coronary CTA showed mild nonobstructive CAD 25 to 49% stenosis in the mid RCA.   Diabetes mellitus type 2, controlled (HCC)    GERD (gastroesophageal reflux disease) 04/05/2012   Hiatal hernia 04/05/2012   Hyperlipidemia 12/01/2012   HE PREFERS TO USE NIACIN AND BRAN TO LOWER HIS TRIGLYCERIDES AND CHOLESTEROL   Obesity    OSA on CPAP  04/05/2012   Testosterone deficiency    Past Surgical History:  Procedure Laterality Date   CHOLECYSTECTOMY     FINGER SURGERY     HERNIA REPAIR     LEG SURGERY Right    WISDOM TOOTH EXTRACTION     Social History   Tobacco Use   Smoking status: Former    Current packs/day: 0.00    Types: Cigarettes    Quit date: 09/22/2012    Years since quitting: 10.9   Smokeless tobacco: Never  Vaping Use   Vaping status: Never Used  Substance Use Topics   Alcohol use: No   Drug use: No   Social History   Socioeconomic History   Marital status: Married    Spouse name: Not on file   Number of children: Not on file   Years of education: Not on file   Highest education level: Not on file  Occupational History   Not on file  Tobacco Use   Smoking status: Former    Current packs/day: 0.00    Types: Cigarettes    Quit date: 09/22/2012    Years since quitting: 10.9   Smokeless tobacco: Never  Vaping Use   Vaping status: Never Used  Substance and Sexual Activity   Alcohol use: No   Drug use: No   Sexual activity: Not on file  Other Topics Concern   Not on file  Social History Narrative   Not on file   Social Determinants of Health   Financial Resource Strain:  Low Risk  (08/23/2023)   Overall Financial Resource Strain (CARDIA)    Difficulty of Paying Living Expenses: Not hard at all  Food Insecurity: No Food Insecurity (08/23/2023)   Hunger Vital Sign    Worried About Running Out of Food in the Last Year: Never true    Ran Out of Food in the Last Year: Never true  Transportation Needs: Patient Declined (11/17/2022)   Received from [facility], [facility]   [facility] - Transportation    Lack of Transportation (Medical): Patient declined    Lack of Transportation (Non-Medical): Patient declined  Physical Activity: Unknown (11/17/2022)   Received from [facility], [facility]   Exercise Vital Sign    Days of Exercise per Week: Patient declined    Minutes of Exercise per  Session: 20 min  Stress: Patient Declined (11/17/2022)   Received from [facility], [facility] Occupational [facility] - Occupational Stress Questionnaire    Feeling of Stress : Patient declined  Social Connections: Socially Integrated (09/25/2022)   Received from [facility], [facility]   Social Network    How would you rate your social network (family, work, friends)?: Good participation with social networks  Intimate Partner Violence: Not At Risk (11/17/2022)   Received from [facility], [facility]   HITS    Over the last 12 months how often did your partner physically hurt you?: 1    Over the last 12 months how often did your partner insult you or talk down to you?: 1    Over the last 12 months how often did your partner threaten you with physical harm?: 1    Over the last 12 months how often did your partner scream or curse at you?: 1   Family Status  Relation Name Status   Mother  Alive   Father  Alive   MGM  Deceased   MGF  Deceased   PGM  Deceased   PGF  Deceased   Brother  Alive  No partnership data on file   Family History  Problem Relation Age of Onset   Alzheimer's disease Mother    Valvular heart disease Father    Valvular heart disease Brother    Allergies  Allergen Reactions   Atorvastatin Other (See Comments)    HYPERGLYCEMIA hyperglycemia   Iodinated Contrast Media Hives   Other     Contrast      Review of Systems  Constitutional:  Negative for chills and fever.  HENT:  Negative for congestion and sore throat.   Respiratory:  Negative for cough and shortness of breath.   Cardiovascular:  Negative for chest pain and leg swelling.  Gastrointestinal:  Negative for nausea and vomiting.  Genitourinary:  Negative for frequency and hematuria.  Musculoskeletal:  Negative for falls and myalgias.  Skin:  Negative for itching and rash.  Neurological:  Negative for dizziness and headaches.  Psychiatric/Behavioral:  Negative for  suicidal ideas. The patient does not have insomnia.    Negative unless indicated in HPI   Objective:     BP 127/78   Pulse 93   Temp (!) 97.5 F (36.4 C) (Temporal)   Ht 6' (1.829 m)   Wt 257 lb (116.6 kg)   SpO2 95%   BMI 34.86 kg/m  BP Readings from Last 3 Encounters:  08/23/23 127/78  02/04/22 104/74  01/21/22 126/76   Wt Readings from Last 3 Encounters:  08/23/23 257 lb (116.6 kg)  04/16/22 246 lb (  111.6 kg)  01/21/22 253 lb 6.4 oz (114.9 kg)      Physical Exam Vitals and nursing note reviewed.  Constitutional:      Appearance: Normal appearance. He is obese.  HENT:     Head: Normocephalic and atraumatic.  Eyes:     General: No scleral icterus.    Extraocular Movements: Extraocular movements intact.     Conjunctiva/sclera: Conjunctivae normal.     Pupils: Pupils are equal, round, and reactive to light.  Cardiovascular:     Rate and Rhythm: Normal rate and regular rhythm.  Pulmonary:     Effort: Pulmonary effort is normal.     Breath sounds: Normal breath sounds.  Abdominal:     General: Bowel sounds are normal. There is no distension.     Palpations: Abdomen is soft.  Musculoskeletal:        General: Normal range of motion.     Cervical back: Normal range of motion and neck supple.     Right lower leg: No edema.  Skin:    General: Skin is warm and dry.     Findings: No rash.  Neurological:     Mental Status: He is alert and oriented to person, place, and time. Mental status is at baseline.  Psychiatric:        Mood and Affect: Mood normal.        Behavior: Behavior normal.        Thought Content: Thought content normal.        Judgment: Judgment normal.     No results found for any visits on 08/23/23.   Lab Results  Component Value Date   GLUCOSE 191 (H) 01/21/2022   NA 139 01/21/2022   K 4.5 01/21/2022   CL 102 01/21/2022   CO2 24 01/21/2022   BUN 15 01/21/2022   CREATININE 0.86 01/21/2022   EGFR 104 01/21/2022   CALCIUM 9.4 01/21/2022           Assessment & Plan:  Encounter for general adult medical examination with abnormal findings -     CBC with Differential/Platelet -     CMP14+EGFR -     Lipid panel -     Thyroid Panel With TSH  Screening PSA (prostate specific antigen) -     PSA, total and free  Diabetes mellitus treated with insulin and oral medication (HCC) -     CMP14+EGFR -     Bayer DCA Hb A1c Waived -     Microalbumin / creatinine urine ratio  Long-term current use of injectable noninsulin antidiabetic medication   Jorge [name] is a 51 yrs old Caucasian male, no acute distress DM2: need to schedule diabetic retia exam; A1c, Foot exam, micro order Labs: CBC, CMP, Lipid, TSH, PSA  Encourage healthy lifestyle choices, including diet (rich in fruits, vegetables, and lean proteins, and low in salt and simple carbohydrates) and exercise (at least 30 minutes of moderate physical activity daily).     The above assessment and management plan was discussed with the patient. The patient verbalized understanding of and has agreed to the management plan. Patient is aware to call the clinic if they develop any new symptoms or if symptoms persist or worsen. Patient is aware when to return to the clinic for a follow-up visit. Patient educated on when it is appropriate to go to the emergency department.  Return in about 3 months (around 11/23/2023) for chronic Diseases.    Arrie Aran Santa Lighter, [facility]  [address] [place], [place] [address] [phone]

## 2023-08-24 ENCOUNTER — Encounter: Payer: Self-pay | Admitting: Nurse Practitioner

## 2023-08-24 LAB — THYROID PANEL WITH TSH
Free Thyroxine Index: 1.7 (ref 1.2–4.9)
T3 Uptake Ratio: 23 % — ABNORMAL LOW (ref 24–39)
T4, Total: 7.2 ug/dL (ref 4.5–12.0)
TSH: 1.21 u[IU]/mL (ref 0.450–4.500)

## 2023-08-24 LAB — CMP14+EGFR
ALT: 20 IU/L (ref 0–44)
AST: 22 IU/L (ref 0–40)
Albumin: 4.3 g/dL (ref 3.8–4.9)
Alkaline Phosphatase: 63 IU/L (ref 44–121)
BUN/Creatinine Ratio: 15 (ref 9–20)
BUN: 15 mg/dL (ref 6–24)
Bilirubin Total: 0.5 mg/dL (ref 0.0–1.2)
CO2: 24 mmol/L (ref 20–29)
Calcium: 9.5 mg/dL (ref 8.7–10.2)
Chloride: 102 mmol/L (ref 96–106)
Creatinine, Ser: 0.98 mg/dL (ref 0.76–1.27)
Globulin, Total: 2.1 g/dL (ref 1.5–4.5)
Glucose: 87 mg/dL (ref 70–99)
Potassium: 3.9 mmol/L (ref 3.5–5.2)
Sodium: 139 mmol/L (ref 134–144)
Total Protein: 6.4 g/dL (ref 6.0–8.5)
eGFR: 92 mL/min/{1.73_m2} (ref >59)

## 2023-08-24 LAB — LIPID PANEL
Cholesterol, Total: 81 mg/dL — ABNORMAL LOW (ref 100–199)
HDL: 37 mg/dL — ABNORMAL LOW (ref >39)
LDL CALC COMMENT:: 2.2 ratio (ref 0.0–5.0)
LDL Chol Calc (NIH): 26 mg/dL (ref 0–99)
Triglycerides: 94 mg/dL (ref 0–149)
VLDL Cholesterol Cal: 18 mg/dL (ref 5–40)

## 2023-08-24 LAB — CBC WITH DIFFERENTIAL/PLATELET
Basophils Absolute: 0 10*3/uL (ref 0.0–0.2)
Basos: 1 %
EOS (ABSOLUTE): 0.3 10*3/uL (ref 0.0–0.4)
Eos: 5 %
Hematocrit: 45.1 % (ref 37.5–51.0)
Hemoglobin: 14.6 g/dL (ref 13.0–17.7)
Immature Grans (Abs): 0 10*3/uL (ref 0.0–0.1)
Immature Granulocytes: 0 %
Lymphocytes Absolute: 1.9 10*3/uL (ref 0.7–3.1)
Lymphs: 29 %
MCH: 30.2 pg (ref 26.6–33.0)
MCHC: 32.4 g/dL (ref 31.5–35.7)
MCV: 93 fL (ref 79–97)
Monocytes Absolute: 0.5 10*3/uL (ref 0.1–0.9)
Monocytes: 8 %
Neutrophils Absolute: 3.6 10*3/uL (ref 1.4–7.0)
Neutrophils: 57 %
Platelets: 240 10*3/uL (ref 150–450)
RBC: 4.83 x10E6/uL (ref 4.14–5.80)
RDW: 14 % (ref 11.6–15.4)
WBC: 6.3 10*3/uL (ref 3.4–10.8)

## 2023-08-24 LAB — PSA, TOTAL AND FREE
PSA, Free Pct: 20 %
PSA, Free: 0.2 ng/mL
Prostate Specific Ag, Serum: 1 ng/mL (ref 0.0–4.0)

## 2023-08-24 LAB — MICROALBUMIN / CREATININE URINE RATIO
Creatinine, Urine: 127.1 mg/dL
Microalb/Creat Ratio: 4 mg/g{creat} (ref 0–29)
Microalbumin, Urine: 5.1 ug/mL

## 2023-08-24 LAB — BAYER DCA HB A1C WAIVED: HB A1C (BAYER DCA - WAIVED): 6.5 % — ABNORMAL HIGH (ref 4.8–5.6)

## 2023-10-04 ENCOUNTER — Encounter: Payer: Self-pay | Admitting: Cardiology

## 2023-10-04 ENCOUNTER — Ambulatory Visit: Payer: Commercial Managed Care - PPO | Attending: Cardiology | Admitting: Cardiology

## 2023-10-04 VITALS — BP 140/90 | HR 106 | Ht 72.0 in | Wt 258.0 lb

## 2023-10-04 DIAGNOSIS — G4733 Obstructive sleep apnea (adult) (pediatric): Secondary | ICD-10-CM | POA: Diagnosis not present

## 2023-10-04 DIAGNOSIS — I2510 Atherosclerotic heart disease of native coronary artery without angina pectoris: Secondary | ICD-10-CM | POA: Diagnosis not present

## 2023-10-04 DIAGNOSIS — E7800 Pure hypercholesterolemia, unspecified: Secondary | ICD-10-CM | POA: Diagnosis not present

## 2023-10-04 NOTE — Progress Notes (Signed)
Date:  10/04/2023   ID:  Jorge [name], DOB May 21, 1971, MRN [id number] The patient was identified using 2 identifiers.  PCP:  Martina Sinner, NP   [facility] Providers Cardiologist:  None Sleep Medicine:  Armanda Magic, MD     Evaluation Performed:  Follow-Up Visit  Chief Complaint:  OSA  History of Present Illness:    Jorge [name] is a 52 y.o. male with  a hx of obstructive sleep apnea on CPAP, CAD and hyperlipidemia. His OSA was initially dx in 2010 with sleep study showing moderate OSA with an AHI of 21/hr but increased to 68/hr during REM sleep.  He was placed on CPAP at 14cm H2O.    He underwent coronary CTA in March which showed a coronary calcium score of 0 with mild soft nonobstructive plaque in the mid RCA, mid LAD of 25 to 49%.    He is here today for followup and is doing well.  He tells me that occasionally he has a pain in his chest pain that is very sporadic and thinks it is muscular related to his work.  There are no associated sx of nausea, diaphoresis or SOB.  He denies any SOB, DOE, PND, orthopnea, dizziness, palpitations or syncope. He has chronic RLE edema due to prior ankle fx. He is compliant with his meds and is tolerating meds with no SE.  He also is getting TKR in January and needs preop clearance.   He is doing well with his PAP device and thinks that he has gotten used to it.  He tolerates the mask and feels the pressure is adequate.  Since going on PAP he feels rested in the am and has no significant daytime sleepiness.  He denies any significant mouth or nasal dryness or nasal congestion.  He does not think that he snores.    Past Medical History:  Diagnosis Date   CAD (coronary artery disease), native coronary artery    Coronary CTA showed mild nonobstructive CAD 25 to 49% stenosis in the mid RCA.   Diabetes mellitus type 2, controlled (HCC)    GERD (gastroesophageal reflux disease) 04/05/2012   Hiatal hernia 04/05/2012    Hyperlipidemia 12/01/2012   HE PREFERS TO USE NIACIN AND BRAN TO LOWER HIS TRIGLYCERIDES AND CHOLESTEROL   Obesity    OSA on CPAP 04/05/2012   Testosterone deficiency    Past Surgical History:  Procedure Laterality Date   CHOLECYSTECTOMY     FINGER SURGERY     HERNIA REPAIR     LEG SURGERY Right    WISDOM TOOTH EXTRACTION       Current Meds  Medication Sig   aspirin 81 MG chewable tablet Chew 81 mg by mouth daily.   CINNAMON PO Take 1 tablet by mouth daily.   esomeprazole (NEXIUM) 20 MG capsule Take 20 mg by mouth 2 (two) times daily before a meal.   gabapentin (NEURONTIN) 300 MG capsule Take 300 mg by mouth 2 (two) times daily.   GARLIC PO Take 1 tablet by mouth daily.   ibuprofen (ADVIL,MOTRIN) 800 MG tablet Take 800 mg by mouth every 8 (eight) hours as needed for moderate pain.   metFORMIN (GLUCOPHAGE-XR) 500 MG 24 hr tablet Take 1,000 mg by mouth 2 (two) times daily.   MOUNJARO 12.5 MG/0.5ML Pen Inject 12.5 mg into the skin once a week.   NIACIN CR PO Take 1 tablet by mouth daily.   OAT BRAN PO Take 1 Dose by mouth 2 (two)  times daily.   Omega-3 Fatty Acids (FISH OIL) 1000 MG CAPS Take 1 capsule by mouth 2 (two) times daily.   rosuvastatin (CRESTOR) 10 MG tablet TAKE 1 TABLET BY MOUTH EVERY DAY   SYRINGE-NEEDLE, DISP, 3 ML 23G X 1" 3 ML MISC 1 each by Does not apply route. 1 EACH BY DOES NOT APPLY ROUTE EVERY 10 DAYS   testosterone cypionate (DEPOTESTOSTERONE CYPIONATE) 200 MG/ML injection Inject 200 mg into the muscle. INJECT 1 ML (200 MG TOTAL) INTO THE MUSCLE EVERY 10 DAYS   TRESIBA FLEXTOUCH 200 UNIT/ML FlexTouch Pen Unit(s) SUB-Q Daily     Allergies:   Atorvastatin, Iodinated contrast media, and Other   Social History   Tobacco Use   Smoking status: Former    Current packs/day: 0.00    Types: Cigarettes    Quit date: 09/22/2012    Years since quitting: 11.0   Smokeless tobacco: Never  Vaping Use   Vaping status: Never Used  Substance Use Topics   Alcohol use:  No   Drug use: No     Family Hx: The patient's family history includes Alzheimer's disease in his mother; Valvular heart disease in his brother and father.  ROS:   Please see the history of present illness.     All other systems reviewed and are negative.   Prior CV studies:   The following studies were reviewed today:  Coronary CTA and PAP compliance download  Labs/Other Tests and Data Reviewed:    EKG Interpretation Date/Time:  Monday October 04 2023 08:44:32 EST Ventricular Rate:  106 PR Interval:  154 QRS Duration:  84 QT Interval:  346 QTC Calculation: 459 R Axis:   80  Text Interpretation: Sinus tachycardia No previous ECGs available Confirmed by Armanda Magic ([id number]) on 10/04/2023 8:49:02 AM    Recent Labs: 08/23/2023: ALT 20; BUN 15; Creatinine, Ser 0.98; Hemoglobin 14.6; Platelets 240; Potassium 3.9; Sodium 139; TSH 1.210   Recent Lipid Panel Lab Results  Component Value Date/Time   CHOL 81 (L) 08/23/2023 04:52 PM   TRIG 94 08/23/2023 04:52 PM   HDL 37 (L) 08/23/2023 04:52 PM   CHOLHDL 2.2 08/23/2023 04:52 PM   LDLCALC 26 08/23/2023 04:52 PM    Wt Readings from Last 3 Encounters:  10/04/23 258 lb (117 kg)  08/23/23 257 lb (116.6 kg)  04/16/22 246 lb (111.6 kg)     Risk Assessment/Calculations:          Objective:    Vital Signs:  BP (!) 140/90   Pulse (!) 106   Ht 6' (1.829 m)   Wt 258 lb (117 kg)   SpO2 97%   BMI 34.99 kg/m   GEN: Well nourished, well developed in no acute distress HEENT: Normal NECK: No JVD; No carotid bruits LYMPHATICS: No lymphadenopathy CARDIAC:RRR, no murmurs, rubs, gallops RESPIRATORY:  Clear to auscultation without rales, wheezing or rhonchi  ABDOMEN: Soft, non-tender, non-distended MUSCULOSKELETAL:  No edema; No deformity  SKIN: Warm and dry NEUROLOGIC:  Alert and oriented x 3 PSYCHIATRIC:  Normal affect  ASSESSMENT & PLAN:    OSA - The patient is tolerating PAP therapy well without any problems. The PAP  download performed by his DME was personally reviewed and interpreted by me today and showed an AHI of 0.3/hr on 14 cm H2O with 77% compliance in using more than 4 hours nightly.  The patient has been using and benefiting from PAP use and will continue to benefit from therapy.   CAD -Coronary CTA  showed mild soft nonobstructive plaque in the mid LAD and RCA with 25 to 49% stenosis -He has had some sporadic chest pain but with no associated sx -I think he needs a Stress PET CT to rule out ischemia -Informed Consent   Shared Decision Making/Informed Consent{ All outpatient stress tests require an informed consent ([id number]) ATTESTATION ORDER       :[id number] The risks [chest pain, shortness of breath, cardiac arrhythmias, dizziness, blood pressure fluctuations, myocardial infarction, stroke/transient ischemic attack, nausea, vomiting, allergic reaction, radiation exposure, metallic taste sensation and life-threatening complications (estimated to be 1 in 10,000)], benefits (risk stratification, diagnosing coronary artery disease, treatment guidance) and alternatives of a cardiac PET stress test were discussed in detail with Mr. Janee Morn and he agrees to proceed. -Continue drug managed with aspirin 81 mg daily and Crestor 10 mg daily with as needed refills  Hyperlipidemia -LDL goal <70 due to CAD  -I have personally reviewed and interpreted outside labs performed by patient's PCP which showed LDL 26 and HDL 37 on 08/23/2023 -Continue prescription drug management with Crestor 10 mg daily with as needed refills  Preoperative cardiovascular examination His perioperative risk of a major cardiac event is 0.4% according to the Revised Cardiac Risk Index (RCRI).  Therefore, the patient is at low risk for perioperative complications.     Recommendations: According to ACC/AHA guidelines, no further cardiovascular testing needed.  The patient may proceed to surgery at acceptable risk.    Time:   Today, I  have spent 15 minutes with the patient with telehealth technology discussing the above problems.     Medication Adjustments/Labs and Tests Ordered: Current medicines are reviewed at length with the patient today.  Concerns regarding medicines are outlined above.   Tests Ordered: Orders Placed This Encounter  Procedures   EKG 12-Lead    Medication Changes: No orders of the defined types were placed in this encounter.   Follow Up:  In Person in 1 year(s)  Signed, Armanda Magic, MD  10/04/2023 8:52 AM    [facility]

## 2023-10-04 NOTE — Patient Instructions (Signed)
Medication Instructions:  Your physician recommends that you continue on your current medications as directed. Please refer to the Current Medication list given to you today.  *If you need a refill on your cardiac medications before your next appointment, please call your pharmacy*   Lab Work: None.  If you have labs (blood work) drawn today and your tests are completely normal, you will receive your results only by: MyChart Message (if you have MyChart) OR A paper copy in the mail If you have any lab test that is abnormal or we need to change your treatment, we will call you to review the results.   Testing/Procedures: How to Prepare for Your Cardiac PET/CT Stress Test:  1. Please do not take these medications before your test:   Medications that may interfere with the cardiac pharmacological stress agent (ex. nitrates - including erectile dysfunction medications, isosorbide mononitrate- [please start to hold this medication the day before the test], tamulosin or beta-blockers) the day of the exam. (Erectile dysfunction medication should be held for at least 72 hrs prior to test) Theophylline containing medications for 12 hours. Dipyridamole 48 hours prior to the test. Your remaining medications may be taken with water.  2. Nothing to eat or drink, except water, 3 hours prior to arrival time.   NO caffeine/decaffeinated products, or chocolate 12 hours prior to arrival.  3. NO perfume, cologne or lotion on chest or abdomen area.            4. Total time is 1 to 2 hours; you may want to bring reading material for the waiting time.  5. Please report to Radiology at the [facility] Main Entrance 30 minutes early for your test.  [address] [place], [place] [address]   Diabetic Preparation:  Hold oral medications. You may take NPH and Lantus insulin. Do not take Humalog or Humulin R (Regular Insulin) the day of your test. Check blood sugars prior to leaving the  house. If able to eat breakfast prior to 3 hour fasting, you may take all medications, including your insulin, Do not worry if you miss your breakfast dose of insulin - start at your next meal. Patients who wear a continuous glucose monitor MUST remove the device prior to scanning.  IF YOU THINK YOU MAY BE PREGNANT, OR ARE NURSING PLEASE INFORM THE TECHNOLOGIST.  In preparation for your appointment, medication and supplies will be purchased.  Appointment availability is limited, so if you need to cancel or reschedule, please call the Radiology Department at [phone] [facility]) OR [phone] [facility])  24 hours in advance to avoid a cancellation fee of $100.00  What to Expect After you Arrive:  Once you arrive and check in for your appointment, you will be taken to a preparation room within the Radiology Department.  A technologist or Nurse will obtain your medical history, verify that you are correctly prepped for the exam, and explain the procedure.  Afterwards,  an IV will be started in your arm and electrodes will be placed on your skin for EKG monitoring during the stress portion of the exam. Then you will be escorted to the PET/CT scanner.  There, staff will get you positioned on the scanner and obtain a blood pressure and EKG.  During the exam, you will continue to be connected to the EKG and blood pressure machines.  A small, safe amount of a radioactive tracer will be injected in your IV to obtain a series of pictures of  your heart along with an injection of a stress agent.    After your Exam:  It is recommended that you eat a meal and drink a caffeinated beverage to counter act any effects of the stress agent.  Drink plenty of fluids for the remainder of the day and urinate frequently for the first couple of hours after the exam.  Your doctor will inform you of your test results within 7-10 business days.  For more information and frequently asked questions, please visit our website :  [email or web address]  For questions about your test or how to prepare for your test, please call: Cardiac Imaging Nurse Navigators Office: [phone]    Follow-Up: At [facility], you and your health needs are our priority.  As part of our continuing mission to provide you with exceptional heart care, we have created designated Provider Care Teams.  These Care Teams include your primary Cardiologist (physician) and Advanced Practice Providers (APPs -  Physician Assistants and Nurse Practitioners) who all work together to provide you with the care you need, when you need it.  We recommend signing up for the patient portal called "MyChart".  Sign up information is provided on this After Visit Summary.  MyChart is used to connect with patients for Virtual Visits (Telemedicine).  Patients are able to view lab/test results, encounter notes, upcoming appointments, etc.  Non-urgent messages can be sent to your provider as well.   To learn more about what you can do with MyChart, go to [email or web address].    Your next appointment:   1 year(s)  Provider:   Dr. Armanda Magic, MD

## 2023-10-21 ENCOUNTER — Telehealth: Payer: Self-pay | Admitting: Cardiology

## 2023-10-21 NOTE — Telephone Encounter (Signed)
   Pre-operative Risk Assessment  LAST VISIT: 10/04/2023 NEXT VISIT: NONE Patient Name: Jorge [name]  DOB: 23-Mar-1971 MRN: [id number]      Request for Surgical Clearance    Procedure:   Left uni knee arthroplasty  Date of Surgery:  Clearance TBD                                 Surgeon:  Eulas Post Surgeon's Group or Practice Name:  [facility] Phone number:  [phone], schedular Silvestre Mesi Fax number:  [phone]   Type of Clearance Requested:   - Medical    Type of Anesthesia:  Spinal   Additional requests/questions:    Sharen Hones   10/21/2023, 4:30 PM

## 2023-10-22 NOTE — Telephone Encounter (Signed)
Dr. Tresa Garter 52 year old male is requesting preoperative cardiac evaluation for left Uni knee arthroplasty.  Procedure has not yet been scheduled.  He was recently seen in the clinic by you on 10/04/2023.  He was stable from a cardiac standpoint at that time.  Would he be acceptable risk for upcoming surgery?  Thank you for your help.  Please direct your response to CV DIV preop pool.  Jorge [name]. Jorge Exley NP-C     10/22/2023, 8:06 AM [facility] [address] Office [phone] Fax [phone] '

## 2023-10-25 NOTE — Telephone Encounter (Signed)
Pre-op team,   Patient has PET stress ordered on 11/18. This will need to be completed prior to pre-op evaluation.   Please inform requesting office.   Thank you!  DW

## 2023-10-27 ENCOUNTER — Encounter: Payer: Self-pay | Admitting: Cardiology

## 2023-10-31 ENCOUNTER — Other Ambulatory Visit: Payer: Self-pay | Admitting: Cardiology

## 2023-11-01 ENCOUNTER — Ambulatory Visit (INDEPENDENT_AMBULATORY_CARE_PROVIDER_SITE_OTHER): Payer: Commercial Managed Care - PPO | Admitting: Nurse Practitioner

## 2023-11-01 ENCOUNTER — Encounter: Payer: Self-pay | Admitting: Nurse Practitioner

## 2023-11-01 VITALS — BP 120/83 | Temp 98.3°F | Ht 72.0 in | Wt 257.0 lb

## 2023-11-01 DIAGNOSIS — E119 Type 2 diabetes mellitus without complications: Secondary | ICD-10-CM

## 2023-11-01 DIAGNOSIS — G8929 Other chronic pain: Secondary | ICD-10-CM | POA: Diagnosis not present

## 2023-11-01 DIAGNOSIS — Z794 Long term (current) use of insulin: Secondary | ICD-10-CM

## 2023-11-01 DIAGNOSIS — Z7984 Long term (current) use of oral hypoglycemic drugs: Secondary | ICD-10-CM

## 2023-11-01 DIAGNOSIS — M25562 Pain in left knee: Secondary | ICD-10-CM

## 2023-11-01 NOTE — Progress Notes (Signed)
Established Patient Office Visit  Subjective  Patient ID: Jorge [name], male    DOB: 30-Oct-1971  Age: 52 y.o. MRN: [id number]  Chief Complaint  Patient presents with   Medical Management of Chronic Issues   HPI Bane Cruces 52 year old male present on November 01, 2023 for surgical clearance. He is scheduled for knee replacement surgery  February 11. PMH CAD, HYPERLIPIDEMIA, OSA, HIATAL HERNIA, GERD DM2 long-term use of insulin Diabetes Mellitus Type II, Follow-up  Lab Results  Component Value Date   HGBA1C 6.5 (H) 08/23/2023   Wt Readings from Last 3 Encounters:  11/01/23 257 lb (116.6 kg)  10/04/23 258 lb (117 kg)  08/23/23 257 lb (116.6 kg)   Last seen for diabetes 2 months ago.  Management since then includes metformin and Mounjero. He reports excellent compliance with treatment. He is not having side effects.  Symptoms: No fatigue No foot ulcerations  No appetite changes No nausea  No paresthesia of the feet  No polydipsia  No polyuria No visual disturbances   No vomiting    Home blood sugar records: fasting range: 120 Episodes of hypoglycemia? No   Current insulin regiment: Evaristo Bury Most Recent Eye Exam: 10/03/2023 Current exercise: none Current diet habits: in general, a "healthy" diet   Pertinent Labs: Lab Results  Component Value Date   CHOL 81 (L) 08/23/2023   HDL 37 (L) 08/23/2023   LDLCALC 26 08/23/2023   TRIG 94 08/23/2023   CHOLHDL 2.2 08/23/2023   Lab Results  Component Value Date   NA 139 08/23/2023   K 3.9 08/23/2023   CREATININE 0.98 08/23/2023   EGFR 92 08/23/2023   MICRALBCREAT 4 08/23/2023     Chronic left knee pain Left knee surgery surgical clearance for knee replacement. Report a long history of chronic knee pain in the left knee, present for approximately 20 yrs. The pain is described as dull, aching, and persistent, with occasional sharp exacerbations, particularly with activities such as walking, climbing stairs, and standing  for prolonged periods. The pain has progressively worsened over time, significantly affecting their mobility and quality of life. Has tried conservative treatments, including physical therapy, NSAIDs, and bracing, with limited relief. They report increased stiffness, especially after periods of rest, and occasional swelling in the knee joint. Denies any fever, redness, or recent trauma. There is no history of systemic illness or inflammatory joint disease.   Patient Active Problem List   Diagnosis Date Noted   Chronic pain of left knee 11/01/2023   Encounter for general adult medical examination with abnormal findings 08/23/2023   Diabetes mellitus treated with insulin and oral medication (HCC) 08/23/2023   Screening PSA (prostate specific antigen) 08/23/2023   Long-term current use of injectable noninsulin antidiabetic medication 08/23/2023   Diabetes mellitus, type 2 (HCC) 03/31/2022   Obesity 03/31/2022   Testosterone deficiency 03/31/2022   Ventral incisional hernia 03/26/2022   CAD (coronary artery disease), native coronary artery 02/05/2022   OSA (obstructive sleep apnea) 09/22/2017   Hyperlipidemia 12/01/2012   GERD (gastroesophageal reflux disease) 04/05/2012   Hiatal hernia 04/05/2012   Past Medical History:  Diagnosis Date   CAD (coronary artery disease), native coronary artery    Coronary CTA showed mild nonobstructive CAD 25 to 49% stenosis in the mid RCA.   Diabetes mellitus type 2, controlled (HCC)    GERD (gastroesophageal reflux disease) 04/05/2012   Hiatal hernia 04/05/2012   Hyperlipidemia 12/01/2012   HE PREFERS TO USE NIACIN AND BRAN TO LOWER HIS TRIGLYCERIDES AND CHOLESTEROL  Obesity    OSA on CPAP 04/05/2012   Testosterone deficiency    Past Surgical History:  Procedure Laterality Date   CHOLECYSTECTOMY     FINGER SURGERY     HERNIA REPAIR     LEG SURGERY Right    WISDOM TOOTH EXTRACTION     Social History   Tobacco Use   Smoking status: Former     Current packs/day: 0.00    Types: Cigarettes    Quit date: 09/22/2012    Years since quitting: 11.1   Smokeless tobacco: Never  Vaping Use   Vaping status: Never Used  Substance Use Topics   Alcohol use: No   Drug use: No   Social History   Socioeconomic History   Marital status: Married    Spouse name: Not on file   Number of children: Not on file   Years of education: Not on file   Highest education level: Not on file  Occupational History   Not on file  Tobacco Use   Smoking status: Former    Current packs/day: 0.00    Types: Cigarettes    Quit date: 09/22/2012    Years since quitting: 11.1   Smokeless tobacco: Never  Vaping Use   Vaping status: Never Used  Substance and Sexual Activity   Alcohol use: No   Drug use: No   Sexual activity: Not on file  Other Topics Concern   Not on file  Social History Narrative   Not on file   Social Drivers of Health   Financial Resource Strain: Low Risk  (08/23/2023)   Overall Financial Resource Strain (CARDIA)    Difficulty of Paying Living Expenses: Not hard at all  Food Insecurity: No Food Insecurity (08/23/2023)   Hunger Vital Sign    Worried About Running Out of Food in the Last Year: Never true    Ran Out of Food in the Last Year: Never true  Transportation Needs: Patient Declined (11/17/2022)   Received from [facility], [facility]   [facility] - Transportation    Lack of Transportation (Medical): Patient declined    Lack of Transportation (Non-Medical): Patient declined  Physical Activity: Unknown (11/17/2022)   [facility]   Exercise Vital Sign    Days of Exercise per Week: Patient declined    Minutes of Exercise per Session: Not on file  Stress: Patient Declined (11/17/2022)   Received from [facility], [facility]   [employer] of [facility]ational [facility] - Occupational Stress Questionnaire    Feeling of Stress : Patient declined  Social Connections: Socially Integrated (09/25/2022)   Received  from [facility], [facility]   Social Network    How would you rate your social network (family, work, friends)?: Good participation with social networks  Intimate Partner Violence: Not At Risk (11/17/2022)   Received from [facility], [facility]   HITS    Over the last 12 months how often did your partner physically hurt you?: Never    Over the last 12 months how often did your partner insult you or talk down to you?: Never    Over the last 12 months how often did your partner threaten you with physical harm?: Never    Over the last 12 months how often did your partner scream or curse at you?: Never   Family Status  Relation Name Status   Mother  Alive   Father  Alive   MGM  Deceased   MGF  Deceased  PGM  Deceased   PGF  Deceased   Brother  Alive  No partnership data on file   Family History  Problem Relation Age of Onset   Alzheimer's disease Mother    Valvular heart disease Father    Valvular heart disease Brother    Allergies  Allergen Reactions   Iodinated Contrast Media Hives   Other     Contrast      ROS Negative unless indicated in HPI   Objective:     BP 120/83   Temp 98.3 F (36.8 C)   Ht 6' (1.829 m)   Wt 257 lb (116.6 kg)   SpO2 95%   BMI 34.86 kg/m  BP Readings from Last 3 Encounters:  11/01/23 120/83  10/04/23 (!) 140/90  08/23/23 127/78   Wt Readings from Last 3 Encounters:  11/01/23 257 lb (116.6 kg)  10/04/23 258 lb (117 kg)  08/23/23 257 lb (116.6 kg)      Physical Exam Vitals and nursing note reviewed.  Constitutional:      Appearance: He is obese.  HENT:     Head: Normocephalic and atraumatic.     Right Ear: Tympanic membrane, ear canal and external ear normal. There is no impacted cerumen.     Left Ear: Tympanic membrane, ear canal and external ear normal. There is no impacted cerumen.     Nose: Nose normal.     Mouth/Throat:     Mouth: Mucous membranes are moist.  Eyes:     General: No scleral icterus.     Extraocular Movements: Extraocular movements intact.     Conjunctiva/sclera: Conjunctivae normal.     Pupils: Pupils are equal, round, and reactive to light.  Cardiovascular:     Rate and Rhythm: Normal rate and regular rhythm.  Pulmonary:     Effort: Pulmonary effort is normal.     Breath sounds: Normal breath sounds.  Abdominal:     General: Bowel sounds are normal.     Palpations: Abdomen is soft. There is no mass.     Tenderness: There is no abdominal tenderness. There is no right CVA tenderness, left CVA tenderness, guarding or rebound.     Hernia: No hernia is present.  Musculoskeletal:        General: Normal range of motion.     Right lower leg: No edema.     Left lower leg: No edema.  Skin:    General: Skin is warm and dry.     Capillary Refill: Capillary refill takes less than 2 seconds.     Findings: No rash.  Neurological:     Mental Status: He is alert and oriented to person, place, and time.  Psychiatric:        Mood and Affect: Mood normal.        Behavior: Behavior normal.        Thought Content: Thought content normal.        Judgment: Judgment normal.       11/01/2023    8:21 AM 08/23/2023    4:10 PM  PHQ9 SCORE ONLY  PHQ-9 Total Score 2 3     No results found for any visits on 11/01/23.  Last CBC Lab Results  Component Value Date   WBC 6.3 08/23/2023   HGB 14.6 08/23/2023   HCT 45.1 08/23/2023   MCV 93 08/23/2023   MCH 30.2 08/23/2023   RDW 14.0 08/23/2023   PLT 240 08/23/2023   Last metabolic panel Lab Results  Component  Value Date   GLUCOSE 87 08/23/2023   NA 139 08/23/2023   K 3.9 08/23/2023   CL 102 08/23/2023   CO2 24 08/23/2023   BUN 15 08/23/2023   CREATININE 0.98 08/23/2023   EGFR 92 08/23/2023   CALCIUM 9.5 08/23/2023   PROT 6.4 08/23/2023   ALBUMIN 4.3 08/23/2023   LABGLOB 2.1 08/23/2023   BILITOT 0.5 08/23/2023   ALKPHOS 63 08/23/2023   AST 22 08/23/2023   ALT 20 08/23/2023   Last lipids Lab Results  Component Value  Date   CHOL 81 (L) 08/23/2023   HDL 37 (L) 08/23/2023   LDLCALC 26 08/23/2023   TRIG 94 08/23/2023   CHOLHDL 2.2 08/23/2023   Last hemoglobin A1c Lab Results  Component Value Date   HGBA1C 6.5 (H) 08/23/2023   Last thyroid functions Lab Results  Component Value Date   TSH 1.210 08/23/2023   T4TOTAL 7.2 08/23/2023        Assessment & Plan:  Diabetes mellitus treated with insulin and oral medication (HCC)  Chronic pain of left knee  Raydan 52 year old Caucasian male seen today for surgical clearance for knee replacement DM2, A1c 6.5 last check 08/23/2023 since A1c is less than 7.8% client is cleared for surgery Continue metformin.  500 mg daily Mounjaro 0.5 mL every 7 days Follow-up with cardiology for stress test as already scheduled No labs necessary today Return for as already scheduled.   Arrie Aran Santa Lighter, [facility] okay [address] [place], [place] [address] [phone]    Note: This document was prepared by Reubin Milan voice dictation technology and any errors that results from this process are unintentional.

## 2023-11-02 ENCOUNTER — Ambulatory Visit: Payer: Commercial Managed Care - PPO | Admitting: Nurse Practitioner

## 2023-11-22 ENCOUNTER — Encounter (HOSPITAL_COMMUNITY): Payer: Self-pay

## 2023-11-24 ENCOUNTER — Ambulatory Visit (HOSPITAL_COMMUNITY): Admission: RE | Admit: 2023-11-24 | Payer: Commercial Managed Care - PPO | Source: Ambulatory Visit

## 2023-11-24 NOTE — Telephone Encounter (Addendum)
   Name: Jorge [name]  DOB: Mar 14, 1971  MRN: [id number]  Primary Cardiologist: Dr. Shlomo  Chart reviewed as part of pre-operative protocol coverage. PET scan pending not yet scheduled. Routed to callback to check on this. Decision to hold ASA will be contingent on cardiac PET results, no hx of obstructive CAD.  Shalondra Wunschel N Caelie R[place], PA-C  11/24/2023, 7:31 AM

## 2023-11-24 NOTE — Telephone Encounter (Signed)
 Spoke with the patient and informed him that the PET appt needs to be rescheduled since it was cancelled today. Message send to PET scheduler to assist in rescheduling. The cardiac PET will need to be scheduled and completed before patient comes in office for preop clearance.

## 2023-11-24 NOTE — Telephone Encounter (Addendum)
 Preop protocol would typically dictate repeat OV before clearing for surgery, but notes indicate the chest pain was possibly MSK in etiology. I will route to Dr. Shlomo to find out if she is OK with preop team clearing for surgery if cardiac PET is normal. This study is scheduled for 12/07/23. Dr. Shlomo - Please route response to P CV DIV PREOP (the pre-op pool). Thank you.  I also sent update to patient via MyChart to keep him informed we will reach out with final recs re: need for appt or not.

## 2023-11-24 NOTE — Telephone Encounter (Signed)
 Informed by nurse that PET was cancelled due to a pre-cert / auth issue

## 2023-11-24 NOTE — Telephone Encounter (Signed)
 Noted, await 12/07/23 PET

## 2023-11-25 ENCOUNTER — Ambulatory Visit: Payer: Commercial Managed Care - PPO | Admitting: Nurse Practitioner

## 2023-12-06 ENCOUNTER — Encounter (HOSPITAL_COMMUNITY): Payer: Self-pay

## 2023-12-07 ENCOUNTER — Ambulatory Visit (HOSPITAL_COMMUNITY)
Admission: RE | Admit: 2023-12-07 | Discharge: 2023-12-07 | Disposition: A | Discharge status: Home / Self Care | Payer: Commercial Managed Care - PPO | Source: Ambulatory Visit | Attending: Cardiology | Admitting: Cardiology

## 2023-12-07 DIAGNOSIS — I2510 Atherosclerotic heart disease of native coronary artery without angina pectoris: Secondary | ICD-10-CM | POA: Diagnosis present

## 2023-12-07 LAB — NM PET CT CARDIAC PERFUSION MULTI W/ABSOLUTE BLOODFLOW
LV dias vol: 153 mL (ref 62–150)
LV sys vol: 91 mL
MBFR: 2.45
Nuc Rest EF: 41 %
Nuc Stress EF: 47 %
Peak HR: 105 {beats}/min
Rest HR: 89 {beats}/min
Rest MBF: 0.75 ml/g/min
Rest Nuclear Isotope Dose: 29.9 mCi
ST Depression (mm): 0 mm
Stress MBF: 1.84 ml/g/min
Stress Nuclear Isotope Dose: 29.9 mCi

## 2023-12-07 MED ORDER — RUBIDIUM RB82 GENERATOR (RUBYFILL)
25.0000 | PACK | Freq: Once | INTRAVENOUS | Status: AC
  Administered 2023-12-07: 29.94 via INTRAVENOUS

## 2023-12-07 MED ORDER — REGADENOSON 0.4 MG/5ML IV SOLN
0.4000 mg | Freq: Once | INTRAVENOUS | Status: AC
  Administered 2023-12-07: 0.4 mg via INTRAVENOUS

## 2023-12-07 MED ORDER — REGADENOSON 0.4 MG/5ML IV SOLN
INTRAVENOUS | Status: AC
  Filled 2023-12-07: qty 5

## 2023-12-07 MED ORDER — RUBIDIUM RB82 GENERATOR (RUBYFILL)
25.0000 | PACK | Freq: Once | INTRAVENOUS | Status: AC
  Administered 2023-12-07: 29.95 via INTRAVENOUS

## 2023-12-08 ENCOUNTER — Encounter: Payer: Self-pay | Admitting: Cardiology

## 2023-12-09 ENCOUNTER — Telehealth: Payer: Self-pay

## 2023-12-09 DIAGNOSIS — I519 Heart disease, unspecified: Secondary | ICD-10-CM

## 2023-12-09 NOTE — Telephone Encounter (Signed)
Echo ordered s/p stress test results.

## 2023-12-16 ENCOUNTER — Other Ambulatory Visit: Payer: Self-pay

## 2023-12-16 ENCOUNTER — Telehealth: Payer: Self-pay

## 2023-12-16 ENCOUNTER — Ambulatory Visit (HOSPITAL_COMMUNITY): Payer: Commercial Managed Care - PPO | Attending: Cardiology

## 2023-12-16 DIAGNOSIS — Z01812 Encounter for preprocedural laboratory examination: Secondary | ICD-10-CM

## 2023-12-16 DIAGNOSIS — I519 Heart disease, unspecified: Secondary | ICD-10-CM | POA: Diagnosis present

## 2023-12-16 DIAGNOSIS — I5022 Chronic systolic (congestive) heart failure: Secondary | ICD-10-CM

## 2023-12-16 LAB — ECHOCARDIOGRAM COMPLETE
Area-P 1/2: 4.4 cm2
S' Lateral: 3.8 cm

## 2023-12-16 NOTE — Telephone Encounter (Signed)
-----   Message from Armanda Magic sent at 12/16/2023 10:59 AM EST ----- Echo showed mildly reduced heart function with EF 45 to 50% with increased stiffness of the heart muscle called diastolic dysfunction.  Study also read out as mildly  dilated aortic root and acing aorta which actually are normal when indexed for body surface area.  Please get a cardiac MRI with gadolinium to assess EF and rule out any infiltrative cardiomyopathy

## 2023-12-16 NOTE — Telephone Encounter (Signed)
Call to patient to discuss echo results, which showed mildly reduced heart function with EF 45 to 50% with increased stiffness of the heart muscle called diastolic dysfunction. Study also read out as mildly dilated aortic root and acing aorta which actually are normal when indexed for body surface area. Patient verbalizes understanding and agrees to cardiac MRI with gadolinium to assess EF and rule out any infiltrative cardiomyopathy. Patient to complete CBC, orders placed.

## 2023-12-17 ENCOUNTER — Encounter: Payer: Self-pay | Admitting: Cardiology

## 2023-12-17 NOTE — Telephone Encounter (Signed)
   Patient Name: Jorge [name]  DOB: [birth year] MRN: [id number]  Primary Cardiologist: None  Chart reviewed as part of pre-operative protocol coverage.  Patient is awaiting cardiac MRI, this will need to be completed prior to surgical clearance.   Joylene Grapes, NP 12/17/2023, 12:01 PM

## 2023-12-20 ENCOUNTER — Other Ambulatory Visit (HOSPITAL_COMMUNITY): Payer: Self-pay | Admitting: *Deleted

## 2023-12-20 MED ORDER — DIPHENHYDRAMINE HCL 50 MG PO TABS: ORAL_TABLET | ORAL | 0 refills | Status: DC

## 2023-12-20 MED ORDER — PREDNISONE 50 MG PO TABS: ORAL_TABLET | ORAL | 0 refills | Status: DC

## 2024-01-26 ENCOUNTER — Telehealth (HOSPITAL_COMMUNITY): Payer: Self-pay | Admitting: *Deleted

## 2024-01-26 NOTE — Telephone Encounter (Signed)
 Reaching out to patient to offer assistance regarding upcoming cardiac imaging study; pt verbalizes understanding of appt date/time, parking situation and where to check in, and medications ordered, and verified current allergies; name and call back number provided for further questions should they arise  Larey Brick RN Navigator Cardiac Imaging [facility] [phone] office [phone] cell  Patient unsure which contrast he is allergic to. I reviewed how to take 13 hour prep with patient and he verbalized understanding. He denies claustrophobia or metal.

## 2024-01-27 ENCOUNTER — Other Ambulatory Visit (HOSPITAL_COMMUNITY): Payer: Self-pay | Admitting: *Deleted

## 2024-01-27 MED ORDER — PREDNISONE 50 MG PO TABS: ORAL_TABLET | ORAL | 0 refills | Status: AC

## 2024-01-27 MED ORDER — DIPHENHYDRAMINE HCL 50 MG PO TABS
ORAL_TABLET | ORAL | 0 refills | Status: AC
Start: 2024-01-27 — End: ?

## 2024-01-28 ENCOUNTER — Other Ambulatory Visit: Payer: Self-pay | Admitting: Cardiology

## 2024-01-28 ENCOUNTER — Ambulatory Visit (HOSPITAL_COMMUNITY)
Admission: RE | Admit: 2024-01-28 | Discharge: 2024-01-28 | Disposition: A | Discharge status: Home / Self Care | Payer: Commercial Managed Care - PPO | Source: Ambulatory Visit | Attending: Cardiology | Admitting: Cardiology

## 2024-01-28 DIAGNOSIS — I5022 Chronic systolic (congestive) heart failure: Secondary | ICD-10-CM

## 2024-01-28 MED ORDER — GADOBUTROL 1 MMOL/ML IV SOLN
10.0000 mL | Freq: Once | INTRAVENOUS | Status: AC | PRN
  Administered 2024-01-28: 10 mL via INTRAVENOUS

## 2024-01-31 ENCOUNTER — Telehealth: Payer: Self-pay

## 2024-01-31 NOTE — Telephone Encounter (Signed)
 Spoke with pt and let him know that he is fine to have knee surgery. Pt stated he would send [place] paperwork to sign for clearance for surgery. All questions, if any, were answered.

## 2024-01-31 NOTE — Telephone Encounter (Signed)
 Spoke with pt regarding his results. Pt verbalized understanding. All questions, if any, were answered. Pt wants to know if the results clear him for knee surgery. Pt was told his question would be forwarded to Dr. Mayford Knife.

## 2024-02-02 NOTE — Telephone Encounter (Signed)
     Primary Cardiologist: Dr. Mayford Knife  Chart reviewed as part of pre-operative protocol coverage. Given past medical history and time since last visit, based on ACC/AHA guidelines, Ameir Faria would be at acceptable risk for the planned procedure without further cardiovascular testing.   He had recent reassuring cardiac MRI and cardiac PET.  His RCRI is low risk, 0.9% risk of major cardiac event.  I will route this recommendation to the requesting party via Epic fax function and remove from pre-op pool.  Please call with questions.  Thomasene Ripple. Shatoya Roets NP-C     02/02/2024, 7:26 AM [facility] [address] Office [phone] Fax [phone]

## 2024-07-12 ENCOUNTER — Encounter: Payer: Self-pay | Admitting: Cardiology

## 2024-08-10 ENCOUNTER — Telehealth (HOSPITAL_BASED_OUTPATIENT_CLINIC_OR_DEPARTMENT_OTHER): Payer: Self-pay | Admitting: *Deleted

## 2024-08-10 ENCOUNTER — Telehealth: Payer: Self-pay

## 2024-08-10 NOTE — Telephone Encounter (Signed)
   Pre-operative Risk Assessment    Patient Name: Jorge [name]  DOB: 1971/11/10 MRN: [id number]   Date of last office visit: 10/04/23 DR. TURNER Date of next office visit: NONE   Request for Surgical Clearance    Procedure:  LEFT KNEE UNICOMPARTMENTAL ARTHROPLASTY  Date of Surgery:  Clearance 09/26/24                                Surgeon:  DR. DOROTHA OLMSTED Surgeon's Group or Practice Name:  [facility] Phone number:  [phone] Fax number:  [phone] SHERRI GAVIN   Type of Clearance Requested:   - Medical  - Pharmacy:  Hold Aspirin     Type of Anesthesia:  Spinal   Additional requests/questions:    Signed, Cadell Gabrielson   08/10/2024, 2:19 PM

## 2024-08-10 NOTE — Telephone Encounter (Signed)
  Patient Consent for Virtual Visit         Jorge [name] has provided verbal consent on 08/10/2024 for a virtual visit (video or telephone). Appointment scheduled for 09/18/2024 at 9am. Med req and consent are complete. Call patient [phone].    CONSENT FOR VIRTUAL VISIT FOR:  Jorge [name]  By participating in this virtual visit I agree to the following:  I hereby voluntarily request, consent and authorize [facility] and its employed or contracted physicians, physician assistants, nurse practitioners or other licensed health care professionals (the Practitioner), to provide me with telemedicine health care services (the "Services) as deemed necessary by the treating Practitioner. I acknowledge and consent to receive the Services by the Practitioner via telemedicine. I understand that the telemedicine visit will involve communicating with the Practitioner through live audiovisual communication technology and the disclosure of certain medical information by electronic transmission. I acknowledge that I have been given the opportunity to request an in-person assessment or other available alternative prior to the telemedicine visit and am voluntarily participating in the telemedicine visit.  I understand that I have the right to withhold or withdraw my consent to the use of telemedicine in the course of my care at any time, without affecting my right to future care or treatment, and that the Practitioner or I may terminate the telemedicine visit at any time. I understand that I have the right to inspect all information obtained and/or recorded in the course of the telemedicine visit and may receive copies of available information for a reasonable fee.  I understand that some of the potential risks of receiving the Services via telemedicine include:  Delay or interruption in medical evaluation due to technological equipment failure or disruption; Information transmitted may not be  sufficient (e.g. poor resolution of images) to allow for appropriate medical decision making by the Practitioner; and/or  In rare instances, security protocols could fail, causing a breach of personal health information.  Furthermore, I acknowledge that it is my responsibility to provide information about my medical history, conditions and care that is complete and accurate to the best of my ability. I acknowledge that Practitioner's advice, recommendations, and/or decision may be based on factors not within their control, such as incomplete or inaccurate data provided by me or distortions of diagnostic images or specimens that may result from electronic transmissions. I understand that the practice of medicine is not an exact science and that Practitioner makes no warranties or guarantees regarding treatment outcomes. I acknowledge that a copy of this consent can be made available to me via my patient portal [facility] MyChart), or I can request a printed copy by calling the office of [facility].    I understand that my insurance will be billed for this visit.   I have read or had this consent read to me. I understand the contents of this consent, which adequately explains the benefits and risks of the Services being provided via telemedicine.  I have been provided ample opportunity to ask questions regarding this consent and the Services and have had my questions answered to my satisfaction. I give my informed consent for the services to be provided through the use of telemedicine in my medical care

## 2024-08-10 NOTE — Telephone Encounter (Signed)
 Appointment scheduled for 09/18/2024 at 9am. Med req and consent are complete. Call patient [phone].

## 2024-08-10 NOTE — Telephone Encounter (Signed)
   Name: Jorge [name]  DOB: [birth year]  MRN: [id number]  Primary Cardiologist: None   Preoperative team, please contact this patient and set up a phone call appointment for further preoperative risk assessment. Please obtain consent and complete medication review. Thank you for your help.  I confirm that guidance regarding antiplatelet and oral anticoagulation therapy has been completed and, if necessary, noted below.  Regarding ASA therapy, we recommend continuation of ASA throughout the perioperative period. However, if the surgeon feels that cessation of ASA is required in the perioperative period, it may be stopped 5-7 days prior to surgery with a plan to resume it as soon as felt to be feasible from a surgical standpoint in the post-operative period.    I also confirmed the patient resides in the state of [place] . As per [facility] Medical Board telemedicine laws, the patient must reside in the state in which the provider is licensed.   Jorge CHRISTELLA Beauvais, NP 08/10/2024, 2:32 PM [facility]

## 2024-09-06 ENCOUNTER — Encounter: Payer: Self-pay | Admitting: Nurse Practitioner

## 2024-09-06 ENCOUNTER — Ambulatory Visit: Admitting: Nurse Practitioner

## 2024-09-06 ENCOUNTER — Encounter: Payer: Self-pay | Admitting: Cardiology

## 2024-09-06 ENCOUNTER — Ambulatory Visit (INDEPENDENT_AMBULATORY_CARE_PROVIDER_SITE_OTHER): Admitting: Nurse Practitioner

## 2024-09-06 VITALS — BP 122/83 | HR 93 | Temp 97.5°F | Ht 72.0 in | Wt 250.2 lb

## 2024-09-06 DIAGNOSIS — I2510 Atherosclerotic heart disease of native coronary artery without angina pectoris: Secondary | ICD-10-CM | POA: Diagnosis not present

## 2024-09-06 DIAGNOSIS — Z01818 Encounter for other preprocedural examination: Secondary | ICD-10-CM | POA: Diagnosis not present

## 2024-09-06 DIAGNOSIS — E782 Mixed hyperlipidemia: Secondary | ICD-10-CM

## 2024-09-06 DIAGNOSIS — Z794 Long term (current) use of insulin: Secondary | ICD-10-CM

## 2024-09-06 DIAGNOSIS — E119 Type 2 diabetes mellitus without complications: Secondary | ICD-10-CM

## 2024-09-06 DIAGNOSIS — Z7984 Long term (current) use of oral hypoglycemic drugs: Secondary | ICD-10-CM

## 2024-09-06 NOTE — Progress Notes (Signed)
 Subjective:  Patient ID: Jorge Jorge [name], Jorge [name]    DOB: 10-25-1971, 53 y.o.   MRN: [id number]  Patient Care Team: Deitra Morton Jorge [name], Nena, NP as PCP - General (Nurse Practitioner) Shlomo Wilbert SAUNDERS, MD as PCP - Sleep Medicine (Cardiology)   Chief Complaint:  No chief complaint on file.   HPI: Jorge Jorge [name] is a 53 y.o. Jorge [name] presenting on 09/06/2024 for No chief complaint on file.   Discussed the use of AI scribe software for clinical note transcription with the patient, who gave verbal consent to proceed.  History of Present Illness Jorge Jorge [name] is a 53 year old Jorge [name] who presents for surgical clearance for a left knee replacement.  He is scheduled for a left knee replacement surgery on November 11th. 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  He has a history of coronary artery disease, which was evaluated with a stress test and cardiac MRI. He is scheduled to see his cardiologist on November 3rd for further evaluation.  He has diabetes mellitus with a recent hemoglobin A1c of 7.9%. He monitors his lab results closely and is concerned about trends in his health markers.  He has a history of hyperlipidemia, focusing on managing his triglycerides and HDL levels. He has been advised to increase his intake of good fats to improve his HDL levels.  He has received several vaccinations recently, including shingles, RSV, pneumonia, and flu vaccines 09/05/2024. He is considering whether to receive the COVID-19 vaccine.  He has a history of obstructive sleep apnea, gastroesophageal reflux disease, and a hiatal hernia. He is currently taking Mounjaro and metformin for diabetes management.      Relevant past medical, surgical, family, and social history reviewed and updated as indicated.  Allergies and medications reviewed and  updated. Data reviewed: Chart in Epic.   Past Medical History:  Diagnosis Date   CAD (coronary artery disease), native coronary artery    Coronary CTA showed mild nonobstructive CAD 25 to 49% stenosis in the mid RCA.   Diabetes mellitus type 2, controlled (HCC)    GERD (gastroesophageal reflux disease) 04/05/2012   Hiatal hernia 04/05/2012   Hyperlipidemia 12/01/2012   HE PREFERS TO USE NIACIN AND BRAN TO LOWER HIS TRIGLYCERIDES AND CHOLESTEROL   Obesity    OSA on CPAP 04/05/2012   Testosterone deficiency     Past Surgical History:  Procedure Laterality Date   CHOLECYSTECTOMY     FINGER SURGERY     HERNIA REPAIR     LEG SURGERY Right    WISDOM TOOTH EXTRACTION      Social History   Socioeconomic History   Marital status: Married    Spouse name: Not on file   Number of children: Not on file   Years of education: Not on file   Highest education level: Not on file  Occupational History   Not on file  Tobacco Use   Smoking status: Former    Current packs/day: 0.00    Types: Cigarettes    Quit date: 09/22/2012    Years since quitting: 11.9   Smokeless tobacco: Never  Vaping Use   Vaping status: Never Used  Substance and Sexual Activity   Alcohol use: No   Drug use: No   Sexual activity: Not on file  Other Topics Concern   Not on file  Social History Narrative  Not on file   Social Drivers of Health   Financial Resource Strain: Patient Declined (09/05/2024)   Overall Financial Resource Strain (CARDIA)    Difficulty of Paying Living Expenses: Patient declined  Food Insecurity: Patient Declined (09/05/2024)   Hunger Vital Sign    Worried About Running Out of Food in the Last Year: Patient declined    Ran Out of Food in the Last Year: Patient declined  Transportation Needs: Patient Declined (09/05/2024)   PRAPARE - Administrator, Civil Service (Medical): Patient declined    Lack of Transportation (Non-Medical): Patient declined  Physical Activity:  Insufficiently Active (09/05/2024)   Exercise Vital Sign    Days of Exercise per Week: 1 day    Minutes of Exercise per Session: 10 min  Stress: Stress Concern Present (09/05/2024)   [employer] of Occupational Health - Occupational Stress Questionnaire    Feeling of Stress: To some extent  Social Connections: Moderately Integrated (09/05/2024)   Social Connection and Isolation Panel    Frequency of Communication with Friends and Family: Never    Frequency of Social Gatherings with Friends and Family: Once a week    Attends Religious Services: More than 4 times per year    Active Member of [employer] or Organizations: Yes    Attends Banker Meetings: 1 to 4 times per year    Marital Status: Married  Catering manager Violence: Not At Risk (02/27/2024)   Received from [facility]   HITS    Over the last 12 months how often did your partner physically hurt you?: Never    Over the last 12 months how often did your partner insult you or talk down to you?: Never    Over the last 12 months how often did your partner threaten you with physical harm?: Never    Over the last 12 months how often did your partner scream or curse at you?: Never    Outpatient Encounter Medications as of 09/06/2024  Medication Sig   aspirin 81 MG chewable tablet Chew 81 mg by mouth daily.   CINNAMON PO Take 1 tablet by mouth daily.   diphenhydrAMINE  (BENADRYL ) 50 MG tablet Take tablet by mouth 1 hour prior to your cardiac MRI.   esomeprazole (NEXIUM) 20 MG capsule Take 20 mg by mouth 2 (two) times daily before a meal.   gabapentin (NEURONTIN) 300 MG capsule Take 300 mg by mouth 2 (two) times daily.   GARLIC PO Take 1 tablet by mouth daily.   ibuprofen (ADVIL,MOTRIN) 800 MG tablet Take 800 mg by mouth every 8 (eight) hours as needed for moderate pain.   metFORMIN (GLUCOPHAGE-XR) 500 MG 24 hr tablet Take 1,000 mg by mouth 2 (two) times daily.   MOUNJARO 12.5 MG/0.5ML Pen Inject 12.5 mg into the skin  once a week.   NIACIN CR PO Take 1 tablet by mouth daily.   OAT BRAN PO Take 1 Dose by mouth 2 (two) times daily.   Omega-3 Fatty Acids (FISH OIL) 1000 MG CAPS Take 1 capsule by mouth 2 (two) times daily.   predniSONE  (DELTASONE ) 50 MG tablet Take tablet by mouth 13 hours, 7 hours, 1 hour prior to your cardiac MRI.   rosuvastatin  (CRESTOR ) 10 MG tablet TAKE 1 TABLET BY MOUTH EVERY DAY   SYRINGE-NEEDLE, DISP, 3 ML 23G X 1 3 ML MISC 1 each by Does not apply route. 1 EACH BY DOES NOT APPLY ROUTE EVERY 10 DAYS   testosterone cypionate (DEPOTESTOSTERONE  CYPIONATE) 200 MG/ML injection Inject 200 mg into the muscle. INJECT 1 ML (200 MG TOTAL) INTO THE MUSCLE EVERY 10 DAYS   TRESIBA FLEXTOUCH 200 UNIT/ML FlexTouch Pen Unit(s) SUB-Q Daily   No facility-administered encounter medications on file as of 09/06/2024.    Allergies  Allergen Reactions   Iodinated Contrast Media Hives   Other     Contrast    Pertinent ROS per HPI, otherwise unremarkable      Objective:  BP 122/83   Pulse 93   Temp (!) 97.5 F (36.4 C)   Ht 6' (1.829 m)   Wt 250 lb 3.2 oz (113.5 kg)   SpO2 96%   BMI 33.93 kg/m    Wt Readings from Last 3 Encounters:  09/06/24 250 lb 3.2 oz (113.5 kg)  11/01/23 257 lb (116.6 kg)  10/04/23 258 lb (117 kg)    Physical Exam Vitals and nursing note reviewed.  Constitutional:      Appearance: He is obese.  HENT:     Head: Normocephalic and atraumatic.     Right Ear: Tympanic membrane, ear canal and external ear normal. There is no impacted cerumen.     Left Ear: Tympanic membrane and external ear normal. There is no impacted cerumen.     Nose: Nose normal.     Mouth/Throat:     Mouth: Mucous membranes are moist.  Eyes:     General: No scleral icterus.    Extraocular Movements: Extraocular movements intact.     Conjunctiva/sclera: Conjunctivae normal.     Pupils: Pupils are equal, round, and reactive to light.  Neck:     Vascular: No carotid bruit.  Cardiovascular:      Heart sounds: Normal heart sounds.  Pulmonary:     Effort: Pulmonary effort is normal.     Breath sounds: Normal breath sounds.  Abdominal:     General: Bowel sounds are normal.     Palpations: Abdomen is soft.  Musculoskeletal:     Cervical back: Normal range of motion and neck supple. No rigidity or tenderness.     Left knee: Decreased range of motion. Tenderness present.     Right lower leg: No edema.     Left lower leg: No edema.  Lymphadenopathy:     Cervical: No cervical adenopathy.  Skin:    General: Skin is warm and dry.  Neurological:     Mental Status: He is alert and oriented to person, place, and time.  Psychiatric:        Mood and Affect: Mood normal.        Behavior: Behavior normal.        Thought Content: Thought content normal.        Judgment: Judgment normal.    Physical Exam      Results for orders placed or performed in visit on 12/16/23  ECHOCARDIOGRAM COMPLETE   Collection Time: 12/16/23  7:26 AM  Result Value Ref Range   S' Lateral 3.80 cm   Area-P 1/2 4.40 cm2   Est EF 45 - 50%        Pertinent labs & imaging results that were available during my care of the patient were reviewed by me and considered in my medical decision making.  Assessment & Plan:  Diagnoses and all orders for this visit:  Pre-operative clearance -     HepB+HepC+HIV Panel     Assessment and Plan Jorge Jorge [name] seen today for surgical clearance, no acute distress  Assessment & Plan Preoperative Evaluation for Left Knee Replacement Scheduled for surgery on September 26, 2024. A1c at 7.9%, close to surgical cutoff. - Attend cardiology appointment on September 18, 2024. - Attend preoperative check at [facility] on September 19, 2024. - Hold Mounjaro 7 days before surgery. - Hold Metformin on the day of surgery. - Hold insulin on the day of surgery, with potential to reduce dosage two days prior.  Type 2 Diabetes Mellitus A1c is 7.9%,  above target for optimal surgical outcomes. Emphasized importance of blood glucose control for recovery and wound healing. - Hold Mounjaro 7 days before surgery. - Hold Metformin on the day of surgery. - Hold insulin on the day of surgery, with potential to reduce dosage two days prior.  Coronary Artery Disease of Native Coronary Artery Recent evaluations indicated genetic heart issues. Preoperative cardiac evaluation scheduled to ensure stability for surgery. - Attend cardiology appointment on September 18, 2024.  Hyperlipidemia Triglycerides slightly elevated, HDL low. Discussed dietary modifications. - Increase intake of good fats such as avocado, olive oil, and nuts. - Consider fish oil supplementation.  General Health Maintenance Recent vaccinations include shingles, RSV, pneumonia, and flu. Discussed COVID vaccine benefits. - Consider COVID vaccination.   Health maintenance: Up-to-date  Continue all other maintenance medications.  Follow up plan: No follow-ups on file.   Continue healthy lifestyle choices, including diet (rich in fruits, vegetables, and lean proteins, and low in salt and simple carbohydrates) and exercise (at least 30 minutes of moderate physical activity daily).  Educational handout given for    Clinical References  Blood Glucose Monitoring, Adult To manage your diabetes, you'll need to keep track of your blood sugar. This is called blood glucose monitoring. Check your blood glucose as often as told. Keep a journal of your results over time. This can help you: Know when to adjust your diabetes management plan with your health care provider. See how food, exercise, illness, and medicines affect your blood glucose. Know what your blood glucose is at any time. Your provider will set specific goals for your blood glucose levels. In many cases, these goals may be: Before meals: 80-130 mg/dL (4.4-7.2 mmol/L). After meals: below 180 mg/dL (10 mmol/L). A1C  level: less than 7%. Supplies needed: Blood glucose meter. Test strips for your meter. Each brand of meter has its own strips. You must use the strips that came with your meter. A lancet. This is a sharp device used to poke your finger. Do not use a lancet more than once. A journal or logbook to write down your results. How to check your blood glucose Checking your blood glucose  Wash your hands with soap and water for at least 20 seconds. Use the lancet to poke the side of your finger. Do not poke the tip of your finger. Also, try not to use the same finger each time. Gently squeeze the finger until a small drop of blood appears. Follow the meter instructions on how to insert the test strip, apply blood to the strip, and use the meter. Write down your result and any notes. Using different sites Some blood glucose meters allow testing on other parts of your body to test your blood. The most common places are the forearm, thigh, upper arm, and palm of the hand. Check your meter's instructions. Using different sites may not be as accurate as your fingers. If you think you have low blood glucose, only use your finger. General tips Blood glucose log  Write down the  result each time you check your blood glucose. Note anything that may be affecting your blood glucose. This can help you and your provider: Look for patterns over time. Adjust your management plan as needed. Check if your meter has an app or lets you download your records to a computer. Most meters keep a record of glucose readings in the meter. If you have type 1 diabetes: Check your blood glucose as often as told. This may be: Before each meal and snack. Two hours after a meal. Before bedtime. If you have symptoms of hypoglycemia. After treating your hypoglycemia. Before doing things that have a risk of injury, such as driving or using machinery. Before and after exercise. Between 2:00 a.m. and 3:00 a.m. You may need to  check your blood glucose more often, such as up to 6-10 times a day, if: You have diabetes that is not well controlled. You are ill. You have a history of severe hypoglycemia. You have hypoglycemia unawareness. If you have type 2 diabetes: You may need to check your blood glucose 2 or more times a day. Check your blood glucose as often as told by your provider. This may include: Before and after exercise. Before doing things that have a risk of injury, such as driving or using machinery. You may need to check your blood glucose more often if: Your medicine is being adjusted. Your diabetes is not well controlled. You are ill. General tips Always have your blood glucose meter and supplies with you. After you use a few boxes of test strips, adjust your blood glucose meter as needed. Follow the meter instructions. If you have questions or need help, all blood glucose meters have a 24-hour hotline phone number that you can call. Also, contact your provider with any questions or concerns. Where to find more information The American Diabetes Association: diabetes.org The Association of Diabetes Care & Education Specialists: diabeteseducator.org Contact a health care provider if: Your blood glucose is at or above 240 mg/dL (86.6 mmol/L) for 2 days in a row. You have been sick or have had a fever for 2 days or longer and are not getting better. You have any of these problems for more than 6 hours: You cannot eat or drink. You have nausea or vomiting. You have diarrhea. Get help right away if: Your blood glucose is lower than 54 mg/dL (3 mmol/L). You become confused, or you have trouble thinking clearly. You have trouble breathing. You have moderate to high ketone levels in your pee. These symptoms may be an emergency. Get help right away. Call 911. Do not wait to see if the symptoms will go away. Do not drive yourself to the hospital. This information is not intended to replace advice given  to you by your health care provider. Make sure you discuss any questions you have with your health care provider. Document Revised: 06/15/2023 Document Reviewed: 09/18/2022 Elsevier Patient Education  2024 Elsevier Inc. Diabetes Action Plan A diabetes action plan is a way for you to manage your symptoms of diabetes, also called diabetes mellitus. The plan is color-coded to guide you on what actions to take based on any symptoms you're having. If you have symptoms in the red zone, you need medical care right away. If you have symptoms in the yellow zone, your diabetes isn't under control, and you may need to make some changes. If you have symptoms in the green zone, you're doing well. Understanding diabetes can take time. Follow the treatment plan that  you created with your health care provider. Know the target range for your blood sugar, also called glucose. Review your plan each time you visit your provider. The target range for my blood sugar level is __________________________ mg/dL. Red zone Get medical help right away if you have any of the following symptoms: A blood sugar test result that's below 54 mg/dL (3 mmol/L). A blood sugar test result that's at or above 240 mg/dL (86.6 mmol/L) for 2 days in a row along with: Extreme thirst and frequent peeing. Confusion or trouble thinking clearly. Moderate or large ketone levels in your pee (urine). Feeling tired or having no energy. Trouble breathing. Sickness or a fever for 2 or more days that's not getting better. These symptoms may be an emergency. Call 911 right away. Do not wait to see if the symptoms will go away. Do not drive yourself to the hospital. If you have very low blood sugar, also called severe hypoglycemia, and you can't eat or drink, you may need glucagon. Make sure a family member or close friend knows how to check your blood sugar and how to give you glucagon. You may need to be treated in a hospital for this  condition. Yellow zone If you have any of the following symptoms, your diabetes isn't under control, and you may need to make some changes: A blood sugar test result that's at or above 240 mg/dL (86.6 mmol/L) for 2 days in a row. Blood sugar test results that are below 70 mg/dL (3.9 mmol/L). Other symptoms of hypoglycemia, such as: Shaking or feeling light-headed. Confusion or irritability. Feeling hungry. Having a fast heartbeat. If you have any yellow zone symptoms: Treat your hypoglycemia by eating or drinking 15 grams of a rapid-acting carbohydrate. Follow the 15:15 rule: Take 15 grams of a rapid-acting carbohydrate, such as: 1 tube of glucose gel. 4 glucose pills. 4 oz (120 mL) of fruit juice. 4 oz (120 mL) of regular (not diet) soda. Check your blood sugar again 15 minutes after you take the carbohydrate. If the second blood sugar test is still at or below 70 mg/dL (3.9 mmol/L), take 15 grams of a carbohydrate again. If your blood sugar doesn't increase above 70 mg/dL (3.9 mmol/L) after 3 tries, get medical help right away. After your blood sugar returns to normal, eat a meal or a snack within 1 hour. Keep taking your daily medicines as told by your provider. Check your blood sugar more often than you normally would. Write down your results. Call your provider if you have trouble keeping your blood sugar in your target range. Green zone These signs mean you're doing well and can continue what you're doing to manage your diabetes: Your blood sugar is within your personal target range. For most people, a blood sugar level before a meal should be 80-130 mg/dL (4.4-7.2 mmol/L). You feel well, and you're able to do daily activities. If you're in the green zone, continue to manage your diabetes as told by your provider. To do this: Eat a healthy diet. Exercise regularly. Check your blood sugar as told. Take your medicines only as told. Where to find more information American  Diabetes Association (ADA): diabetes.org Association of Diabetes Care & Education Specialists (ADCES): adces.org/diabetes-education-dsmes This information is not intended to replace advice given to you by your health care provider. Make sure you discuss any questions you have with your health care provider. Document Revised: 06/23/2023 Document Reviewed: 06/23/2023 Elsevier Patient Education  2024 Elsevier Inc. Diabetes Mellitus and  Exercise Regular exercise is important for your health, especially if you have diabetes mellitus. Exercise is not just about losing weight. It can also help you increase muscle strength and bone density and reduce body fat and stress. This can help your level of endurance and make you more fit and flexible. Why should I exercise if I have diabetes? Exercise has many benefits for people with diabetes. It can: Help lower and control your blood sugar (glucose). Help your body respond better and become more sensitive to the hormone insulin. Reduce how much insulin your body needs. Lower your risk for heart disease by: Lowering how much bad cholesterol and triglycerides you have in your body. Increasing how much good cholesterol you have in your body. Lowering your blood pressure. Lowering your blood glucose levels. What is my activity plan? Your health care provider or an expert trained in diabetes care (certified diabetes educator) can help you make an activity plan. This plan can help you find the type of exercise that works for you. It may also tell you how often to exercise and for how long. Be sure to: Get at least 150 minutes of medium-intensity or high-intensity exercise each week. This may involve brisk walking, biking, or water aerobics. Do stretching and strengthening exercises at least 2 times a week. This may involve yoga or weight lifting. Spread out your activity over at least 3 days of the week. Get some form of physical activity each day. Do not go  more than 2 days in a row without some kind of activity. Avoid being inactive for more than 30 minutes at a time. Take frequent breaks to walk or stretch. Choose activities that you enjoy. Set goals that you know you can accomplish. Start slowly and increase the intensity of your exercise over time. How do I manage my diabetes during exercise?  Monitor your blood glucose Check your blood glucose before and after you exercise. If your blood glucose is 240 mg/dL (86.6 mmol/L) or higher before you exercise, check your urine for ketones. These are chemicals created by the liver. If you have ketones in your urine, do not exercise until your blood glucose returns to normal. If your blood glucose is 100 mg/dL (5.6 mmol/L) or lower, eat a snack that has 15-20 grams of carbohydrate in it. Check your blood glucose 15 minutes after the snack to make sure that your level is above 100 mg/dL (5.6 mmol/L) before you start to exercise. Your risk for low blood glucose (hypoglycemia) goes up during and after exercise. Know the symptoms of this condition and how to treat it. Follow these instructions at home: Keep a carbohydrate snack on hand for use before, during, and after exercise. This can help prevent or treat hypoglycemia. Avoid injecting insulin into parts of your body that are going to be used during exercise. This may include: Your arms, when you are going to play tennis. Your legs, when you are about to go jogging. Keep track of your exercise habits. This can help you and your health care provider watch and adjust your activity plan. Write down: What you eat before and after you exercise. Blood glucose levels before and after you exercise. The type and amount of exercise you do. Talk to your health care provider before you start a new activity. They may need to: Make sure that the activity is safe for you. Adjust your insulin, other medicines, and food that you eat. Drink water while you exercise.  This can stop you  from losing too much water (dehydration). It can also prevent problems caused by having a lot of heat in your body (heat stroke). Where to find more information American Diabetes Association: diabetes.org Association of Diabetes Care & Education Specialists: diabeteseducator.org This information is not intended to replace advice given to you by your health care provider. Make sure you discuss any questions you have with your health care provider. Document Revised: 04/22/2022 Document Reviewed: 04/22/2022 Elsevier Patient Education  2024 Elsevier Inc. Dyslipidemia Dyslipidemia is an imbalance of waxy, fat-like substances (lipids) in the blood. The body needs lipids in small amounts. Dyslipidemia often involves a high level of cholesterol or triglycerides, which are types of lipids. Common forms of dyslipidemia include: High levels of LDL cholesterol. LDL is the type of cholesterol that causes fatty deposits (plaques) to build up in the blood vessels that carry blood away from the heart (arteries). Low levels of HDL cholesterol. HDL cholesterol is the type of cholesterol that protects against heart disease. High levels of HDL remove the LDL buildup from arteries. High levels of triglycerides. Triglycerides are a fatty substance in the blood that is linked to a buildup of plaques in the arteries. What are the causes? There are two main types of dyslipidemia: primary and secondary. Primary dyslipidemia is caused by changes (mutations) in genes that are passed down through families (inherited). These mutations cause several types of dyslipidemia. Secondary dyslipidemia may be caused by various risk factors that can lead to the disease, such as lifestyle choices and certain medical conditions. What increases the risk? You are more likely to develop this condition if you are an older man or if you are a woman who has gone through menopause. Other risk factors include: Having a family  history of dyslipidemia. Taking certain medicines, including birth control pills, steroids, some diuretics, and beta-blockers. Eating a diet high in saturated fat. Smoking cigarettes or excessive alcohol intake. Having certain medical conditions such as diabetes, polycystic ovary syndrome (PCOS), kidney disease, liver disease, or hypothyroidism. Not exercising regularly. Being overweight or obese with too much belly fat. What are the signs or symptoms? In most cases, dyslipidemia does not usually cause any symptoms. In severe cases, very high lipid levels can cause: Fatty bumps under the skin (xanthomas). A white or gray ring around the black center (pupil) of the eye. Very high triglyceride levels can cause inflammation of the pancreas (pancreatitis). How is this diagnosed? Your health care provider may diagnose dyslipidemia based on a routine blood test (fasting blood test). Because most people do not have symptoms of the condition, this blood testing (lipid profile) is done on adults age 61 and older and is repeated every 4-6 years. This test checks: Total cholesterol. This measures the total amount of cholesterol in your blood, including LDL cholesterol, HDL cholesterol, and triglycerides. A healthy number is below 200 mg/dL (4.82 mmol/L). LDL cholesterol. The target number for LDL cholesterol is different for each person, depending on individual risk factors. A healthy number is usually below 100 mg/dL (7.40 mmol/L). Ask your health care provider what your LDL cholesterol should be. HDL cholesterol. An HDL level of 60 mg/dL (8.44 mmol/L) or higher is best because it helps to protect against heart disease. A number below 40 mg/dL (8.96 mmol/L) for men or below 50 mg/dL (8.70 mmol/L) for women increases the risk for heart disease. Triglycerides. A healthy triglyceride number is below 150 mg/dL (8.30 mmol/L). If your lipid profile is abnormal, your health care provider may do other  blood  tests. How is this treated? Treatment depends on the type of dyslipidemia that you have and your other risk factors for heart disease and stroke. Your health care provider will have a target range for your lipid levels based on this information. Treatment for dyslipidemia starts with lifestyle changes, such as diet and exercise. Your health care provider may recommend that you: Get regular exercise. Make changes to your diet. Quit smoking if you smoke. Limit your alcohol intake. If diet changes and exercise do not help you reach your goals, your health care provider may also prescribe medicine to lower lipids. The most commonly prescribed type of medicine lowers your LDL cholesterol (statin drug). If you have a high triglyceride level, your provider may prescribe another type of drug (fibrate) or an omega-3 fish oil supplement, or both. Follow these instructions at home: Eating and drinking  Follow instructions from your health care provider or dietitian about eating or drinking restrictions. Eat a healthy diet as told by your health care provider. This can help you reach and maintain a healthy weight, lower your LDL cholesterol, and raise your HDL cholesterol. This may include: Limiting your calories, if you are overweight. Eating more fruits, vegetables, whole grains, fish, and lean meats. Limiting saturated fat, trans fat, and cholesterol. Do not drink alcohol if: Your health care provider tells you not to drink. You are pregnant, may be pregnant, or are planning to become pregnant. If you drink alcohol: Limit how much you have to: 0-1 drink a day for women. 0-2 drinks a day for men. Know how much alcohol is in your drink. In the [place] one drink equals one 12 oz bottle of beer (355 mL), one 5 oz glass of wine (148 mL), or one 1 oz glass of hard liquor (44 mL). Activity Get regular exercise. Start an exercise and strength training program as told by your health care provider. Ask your  health care provider what activities are safe for you. Your health care provider may recommend: 30 minutes of aerobic activity 4-6 days a week. Brisk walking is an example of aerobic activity. Strength training 2 days a week. General instructions Do not use any products that contain nicotine or tobacco. These products include cigarettes, chewing tobacco, and vaping devices, such as e-cigarettes. If you need help quitting, ask your health care provider. Take over-the-counter and prescription medicines only as told by your health care provider. This includes supplements. Keep all follow-up visits. This is important. Contact a health care provider if: You are having trouble sticking to your exercise or diet plan. You are struggling to quit smoking or to control your use of alcohol. Summary Dyslipidemia often involves a high level of cholesterol or triglycerides, which are types of lipids. Treatment depends on the type of dyslipidemia that you have and your other risk factors for heart disease and stroke. Treatment for dyslipidemia starts with lifestyle changes, such as diet and exercise. Your health care provider may prescribe medicine to lower lipids. This information is not intended to replace advice given to you by your health care provider. Make sure you discuss any questions you have with your health care provider. Document Revised: 06/05/2022 Document Reviewed: 01/06/2021 Elsevier Patient Education  2025 [employer] Preventing High Cholesterol Cholesterol is a white, waxy substance similar to fat that the human body needs to help build cells. The liver makes all the cholesterol that a person's body needs. Having high cholesterol (hypercholesterolemia) increases your risk for heart disease and stroke. Extra  or excess cholesterol comes from the food that you eat. High cholesterol can often be prevented with diet and lifestyle changes. If you already have high cholesterol, you can control it  with diet, lifestyle changes, and medicines. How can high cholesterol affect me? If you have high cholesterol, fatty deposits (plaques) may build up on the walls of your blood vessels. The blood vessels that carry blood away from your heart are called arteries. Plaques make the arteries narrower and stiffer. This in turn can: Restrict or block blood flow and cause blood clots to form. Increase your risk for heart attack and stroke. What can increase my risk for high cholesterol? This condition is more likely to develop in people who: Eat foods that are high in saturated fat or cholesterol. Saturated fat is mostly found in foods that come from animal sources. Are overweight. Are not getting enough exercise. Use products that contain nicotine or tobacco, such as cigarettes, e-cigarettes, and chewing tobacco. Have a family history of high cholesterol (familial hypercholesterolemia). What actions can I take to prevent this? Nutrition  Eat less saturated fat. Avoid trans fats (partially hydrogenated oils). These are often found in margarine and in some baked goods, fried foods, and snacks bought in packages. Avoid precooked or cured meat, such as bacon, sausages, or meat loaves. Avoid foods and drinks that have added sugars. Eat more fruits, vegetables, and whole grains. Choose healthy sources of protein, such as fish, poultry, lean cuts of red meat, beans, peas, lentils, and nuts. Choose healthy sources of fat, such as: Nuts. Vegetable oils, especially olive oil. Fish that have healthy fats, such as omega-3 fatty acids. These fish include mackerel or salmon. Lifestyle Lose weight if you are overweight. Maintaining a healthy body mass index (BMI) can help prevent or control high cholesterol. It can also lower your risk for diabetes and high blood pressure. Ask your health care provider to help you with a diet and exercise plan to lose weight safely. Do not use any products that contain nicotine  or tobacco. These products include cigarettes, chewing tobacco, and vaping devices, such as e-cigarettes. If you need help quitting, ask your health care provider. Alcohol use Do not drink alcohol if: Your health care provider tells you not to drink. You are pregnant, may be pregnant, or are planning to become pregnant. If you drink alcohol: Limit how much you have to: 0-1 drink a day for women. 0-2 drinks a day for men. Know how much alcohol is in your drink. In the [place] one drink equals one 12 oz bottle of beer (355 mL), one 5 oz glass of wine (148 mL), or one 1 oz glass of hard liquor (44 mL). Activity  Get enough exercise. Do exercises as told by your health care provider. Each week, do at least 150 minutes of exercise that takes a medium level of effort (moderate-intensity exercise). This kind of exercise: Makes your heart beat faster while allowing you to still be able to talk. Can be done in short sessions several times a day or longer sessions a few times a week. For example, on 5 days each week, you could walk fast or ride your bike 3 times a day for 10 minutes each time. Medicines Your health care provider may recommend medicines to help lower cholesterol. This may be a medicine to lower the amount of cholesterol that your liver makes. You may need medicine if: Diet and lifestyle changes have not lowered your cholesterol enough. You have high cholesterol  and other risk factors for heart disease or stroke. Take over-the-counter and prescription medicines only as told by your health care provider. General information Manage your risk factors for high cholesterol. Talk with your health care provider about all your risk factors and how to lower your risk. Manage other conditions that you have, such as diabetes or high blood pressure (hypertension). Have blood tests to check your cholesterol levels at regular points in time as told by your health care provider. Keep all follow-up  visits. This is important. Where to find more information American Heart Association: www.heart.org National Heart, Lung, and Blood Institute: PopSteam.is Summary High cholesterol increases your risk for heart disease and stroke. By keeping your cholesterol level low, you can reduce your risk for these conditions. High cholesterol can often be prevented with diet and lifestyle changes. Work with your health care provider to manage your risk factors, and have your blood tested regularly. This information is not intended to replace advice given to you by your health care provider. Make sure you discuss any questions you have with your health care provider. Document Revised: 06/05/2022 Document Reviewed: 01/06/2021 Elsevier Patient Education  2024 Else[employer] Inc. Preparing for Knee Replacement Preparing for your knee replacement surgery can make your recovery easier and more comfortable. Talk with your health care provider so you can learn what will happen before, during, and after surgery. Ask questions if there's something you don't understand. Tell a health care provider about: Any allergies you have. All medicines you're taking, including vitamins, herbs, eye drops, creams, and over-the-counter medicines. Any problems you or family members have had with anesthesia. Any bleeding problems you have. Any surgeries you've had. Any medical conditions you have. Whether you're pregnant or may be pregnant. What happens before the procedure? Visit your providers You'll need to have a physical exam. When you go to the exam, bring a list of all the medicines and supplements you take. This includes herbs and vitamins. You may need more tests to check if it's safe for you to have surgery. Visit your dentist. Get your teeth cleaned and checked before the surgery. Germs from your mouth or an open wound can travel to your new joint and infect it. Tell your dentist that you plan to have a knee  replacement. Keep all appointments. Know the costs of surgery To find out about the costs, call your insurance provider as soon as you decide to have surgery. Ask questions like: How much of the surgery and hospital stay will be covered? What will be covered for: Medical device? Physical therapy? Care at home? Prepare your home Pick a recovery spot that's not your bed. It's good to sit up a lot while you're getting better. A reclining chair might be a good spot. Put things that you often use on a small table near your spot. These may include the TV remote, a cordless phone or your mobile phone, a book, a laptop, and a water glass. Put the things you need on shelves and in drawers that are as high as your kitchen counter. Do this in your kitchen, bathroom, and bedroom. This way, everything you need will be easy to reach. You may be given a walker to use at home. Check if you have enough room to use it. Try walking around your home with your hands out about 6 inches (15 cm) from your sides. If you don't hit anything while doing this, then you have enough room. Try walking from: Your spot to your  kitchen and bathroom. Your bed to the bathroom. Prepare some meals to freeze and reheat later. Make your home safe for recovery     To prevent tripping, clear your floors. Pick up any clutter and throw rugs. Consider getting: Grab bars to put in the shower and near the toilet. A raised toilet seat. This will help you get on and off the toilet more easily. A tub or shower bench. Prepare your body If you smoke, quit as soon as you can. If there's time, it is best to quit several months before surgery. Tell your provider if you use any products that contain nicotine or tobacco. These products include cigarettes, chewing tobacco, and vaping devices, such as e-cigarettes. These can delay healing after surgery. If you need help quitting, ask your provider. Talk to your provider about doing exercises  before your surgery. Doing these exercises in the weeks before your surgery may help lessen pain and improve movement in your knee after surgery. Do the exercises given by your provider. Eat a healthy diet. Do not change your diet unless your provider tells you to do that. Do not drink any alcohol for at least 48 hours before surgery. Plan your recovery In the first few weeks after surgery, doing your usual activities might be tough. You may get tired quickly, and you won't be able to move your leg as much. To make sure you have all the help you need after your surgery: Plan to have a responsible adult take you home from the hospital. You will not be allowed to drive. Your provider will tell you how many days you can expect to be in the hospital. Do not do your normal tasks and duties for at least 4-6 weeks after surgery. This includes work, caring for others, and volunteering. Plan to have a responsible adult stay with you day and night for the first week. This person should be someone you're comfortable with. You may need this person to help you with your exercises and with personal care, such as bathing and using the toilet. If you live alone, arrange for someone to take care of your home and pets for the first 4-6 weeks after surgery. Arrange for drivers to take you to follow-up visits, the grocery store, and other places you may need to go for at least 4-6 weeks. Consider applying for a disability parking permit. To get an application, call your local department of motor vehicles ([employer]) or your provider's office. This information is not intended to replace advice given to you by your health care provider. Make sure you discuss any questions you have with your health care provider. Document Revised: 02/12/2023 Document Reviewed: 02/12/2023 Elsevier Patient Education  2024 Elsevier Inc.  The above assessment and management plan was discussed with the patient. The patient verbalized  understanding of and has agreed to the management plan. Patient is aware to call the clinic if they develop any new symptoms or if symptoms persist or worsen. Patient is aware when to return to the clinic for a follow-up visit. Patient educated on when it is appropriate to go to the emergency department.    Kemonte Ullman [place]ham, DNP [facility] [address] [place], [place] [address] [phone]

## 2024-09-07 ENCOUNTER — Ambulatory Visit: Payer: Self-pay | Admitting: Nurse Practitioner

## 2024-09-07 LAB — HEPB+HEPC+HIV PANEL
HIV Screen 4th Generation wRfx: NONREACTIVE
Hep B C IgM: NEGATIVE
Hep B Core Total Ab: NEGATIVE
Hep B E Ab: NONREACTIVE
Hep B E Ag: NEGATIVE
Hep B Surface Ab, Qual: NONREACTIVE
Hep C Virus Ab: NONREACTIVE
Hepatitis B Surface Ag: NEGATIVE

## 2024-09-12 NOTE — Progress Notes (Signed)
 Sent message, via epic in basket, requesting orders in epic from Careers adviser.

## 2024-09-18 ENCOUNTER — Ambulatory Visit: Attending: Cardiovascular Disease | Admitting: Nurse Practitioner

## 2024-09-18 ENCOUNTER — Encounter: Payer: Self-pay | Admitting: Nurse Practitioner

## 2024-09-18 DIAGNOSIS — Z01810 Encounter for preprocedural cardiovascular examination: Secondary | ICD-10-CM | POA: Diagnosis not present

## 2024-09-18 NOTE — Progress Notes (Signed)
 COVID Vaccine Completed:  Date of COVID positive in last 90 days:  PCP - Nena Deitra Morton Sebastian, NP Cardiologist - Wilbert Bihari, MD Endocrinologist- Norleen Lipoma, MD  Medical clearance in media tab dated 07/24/24 in Epic   Chest x-ray - N/A EKG - 10/04/23 Epic Stress Test - 12/07/23 Epic ECHO - 12/16/23 Epic Cardiac Cath - n/a Pacemaker/ICD device last checked:N/A Spinal Cord Stimulator:N/A  Bowel Prep - N/A  Sleep Study - yes CPAP -  yes every night  Fasting Blood Sugar - 75-120 Checks Blood Sugar 2 times a day  Last dose of GLP1 agonist-  Mounjaro, takes on Fridays GLP1 instructions:  Do not take after 09/18/24   Last dose of SGLT-2 inhibitors-  N/A SGLT-2 instructions:  Do not take after     Blood Thinner Instructions: N/A Last dose:   Time: Aspirin Instructions: ASA 81, hold 7 days Last Dose:  Activity level: Can go up a flight of stairs and perform activities of daily living without stopping and without symptoms of chest pain or shortness of breath. Difficulty with stairs due to knee   Anesthesia review: CAD, DM2, OSA, family history MVP  Patient denies shortness of breath, fever, cough and chest pain at PAT appointment  Patient verbalized understanding of instructions that were given to them at the PAT appointment. Patient was also instructed that they will need to review over the PAT instructions again at home before surgery.

## 2024-09-18 NOTE — Progress Notes (Signed)
 Virtual Visit via Telephone Note   Because of Jorge [name] co-morbid illnesses, he is at least at moderate risk for complications without adequate follow up.  This format is felt to be most appropriate for this patient at this time.  Due to technical limitations with video connection (technology), today's appointment will be conducted as an audio only telehealth visit, and Jorge [name] verbally agreed to proceed in this manner.   All issues noted in this document were discussed and addressed.  No physical exam could be performed with this format.  Evaluation Performed:  Preoperative cardiovascular risk assessment _____________   Date:  09/18/2024   Patient ID:  Jorge [name], DOB Dec 15, 1970, MRN [id number] Patient Location:  Home Provider location:   Office  Primary Care Provider:  Deitra Morton [name] Nena, NP Primary Cardiologist:  Wilbert Bihari, MD  Chief Complaint / Patient Profile   53 y.o. y/o male with a h/o coronary artery disease with mild soft nonobstructive plaque in mid RCA, mid LAD 25 to 49%, low risk cardiac PET CT 11/2023, mildly reduced LVEF on echo with cardiac MRI 01/2019 with LVEF 50%, RVEF 52%, no additional significant abnormalities, OSA on CPAP, obesity, diabetes, hyperlipidemia who is pending left knee unicompartmental arthroplasty with Dr. Josefina on 09/26/24 and presents today for telephonic preoperative cardiovascular risk assessment.  History of Present Illness    Jorge [name] is a 53 y.o. male who presents via audio/video conferencing for a telehealth visit today.  Pt was last seen in cardiology clinic on 10/04/23 by Dr. Bihari.  At that time Jorge [name] was doing well.  The patient is now pending procedure as outlined above. Since his last visit, he denies chest pain, shortness of breath, lower extremity edema, fatigue, palpitations, melena, hematuria, hemoptysis, diaphoresis, weakness, presyncope, syncope, orthopnea, and PND. He has achieved weight loss  on GLP1 and is looking forward to resuming exercise. He has been limited by knee pain but is still able to achieve > 4 METS activity without concerning cardiac symptoms.    Past Medical History    Past Medical History:  Diagnosis Date   CAD (coronary artery disease), native coronary artery    Coronary CTA showed mild nonobstructive CAD 25 to 49% stenosis in the mid RCA.   Diabetes mellitus type 2, controlled (HCC)    GERD (gastroesophageal reflux disease) 04/05/2012   Hiatal hernia 04/05/2012   Hyperlipidemia 12/01/2012   HE PREFERS TO USE NIACIN AND BRAN TO LOWER HIS TRIGLYCERIDES AND CHOLESTEROL   Obesity    OSA on CPAP 04/05/2012   Testosterone deficiency    Past Surgical History:  Procedure Laterality Date   CHOLECYSTECTOMY     FINGER SURGERY     HERNIA REPAIR     LEG SURGERY Right    WISDOM TOOTH EXTRACTION      Allergies  Allergies  Allergen Reactions   Iodinated Contrast Media Hives    Home Medications    Prior to Admission medications   Medication Sig Start Date End Date Taking? Authorizing Provider  aspirin 81 MG chewable tablet Chew 81 mg by mouth daily.    [provider]  CINNAMON PO Take 1 tablet by mouth daily.    [provider]  diphenhydrAMINE  (BENADRYL ) 50 MG tablet Take tablet by mouth 1 hour prior to your cardiac MRI. Patient not taking: Reported on 09/15/2024 01/27/24   Bihari Wilbert SAUNDERS, MD  esomeprazole (NEXIUM) 20 MG capsule Take 20 mg by mouth in the morning and at bedtime.  [provider]  gabapentin (NEURONTIN) 300 MG capsule Take 300 mg by mouth 2 (two) times daily.    [provider]  GARLIC PO Take 1 tablet by mouth See admin instructions. Take 1 tablet by mouth daily every other month.    [provider]  ibuprofen (ADVIL,MOTRIN) 800 MG tablet Take 800 mg by mouth See admin instructions. Take 800 mg by mouth in the morning and evening and may take an additional 400 mg in the afternoon if needed  for pain    [provider]  metFORMIN (GLUCOPHAGE-XR) 500 MG 24 hr tablet Take 1,000 mg by mouth 2 (two) times daily. 03/26/22   [provider]  MOUNJARO 12.5 MG/0.5ML Pen Inject 12.5 mg into the skin once a week.    [provider]  NIACIN CR PO Take 1 tablet by mouth See admin instructions. Take 1 tablet by mouth daily every other month.    [provider]  NON FORMULARY Pt uses a c-pap nightly    [provider]  OAT BRAN PO Take 1 capsule by mouth 2 (two) times daily.    [provider]  Omega-3 Fatty Acids (FISH OIL) 1000 MG CAPS Take 1,000 mg by mouth daily.    [provider]  predniSONE  (DELTASONE ) 50 MG tablet Take tablet by mouth 13 hours, 7 hours, 1 hour prior to your cardiac MRI. Patient not taking: Reported on 09/15/2024 01/27/24   Shlomo Wilbert SAUNDERS, MD  rosuvastatin  (CRESTOR ) 10 MG tablet TAKE 1 TABLET BY MOUTH EVERY DAY 11/02/23   Turner, Wilbert SAUNDERS, MD  SYRINGE-NEEDLE, DISP, 3 ML 23G X 1 3 ML MISC 1 each by Does not apply route. 1 EACH BY DOES NOT APPLY ROUTE EVERY 10 DAYS    [provider]  testosterone cypionate (DEPOTESTOSTERONE CYPIONATE) 200 MG/ML injection Inject 100 mg into the muscle every 7 (seven) days.    [provider]  TRESIBA FLEXTOUCH 200 UNIT/ML FlexTouch Pen Inject 70 Units into the skin at bedtime. 03/04/22   [provider]    Physical Exam    Vital Signs:  Jorge [name] does not have vital signs available for review today.  Given telephonic nature of communication, physical exam is limited. AAOx3. NAD. Normal affect.  Speech and respirations are unlabored.  Accessory Clinical Findings    None  Assessment & Plan    1.  Preoperative Cardiovascular Risk Assessment: According to the Revised Cardiac Risk Index (RCRI), his Perioperative Risk of Major Cardiac Event is (%): 6.6. His Functional Capacity in METs is: 6.61 according to the Duke Activity Status Index (DASI). The  patient is doing well from a cardiac perspective. Therefore, based on ACC/AHA guidelines, the patient would be at acceptable risk for the planned procedure without further cardiovascular testing.   Ideally aspirin should be continued without interruption, however if the bleeding risk is too great, aspirin may be held for 5-7 days prior to surgery. Please resume aspirin post operatively when it is felt to be safe from a bleeding standpoint.   The patient was advised that if he develops new symptoms prior to surgery to contact our office to arrange for a follow-up visit, and he verbalized understanding.  A copy of this note will be routed to requesting surgeon.  Time:   Today, I have spent 10 minutes with the patient with telehealth technology discussing medical history, symptoms, and management plan.     Rosaline EMERSON Bane, NP-C  09/18/2024, 9:04 AM [address],  [address] [place], [place] [address] Office [phone] Fax [phone]

## 2024-09-18 NOTE — H&P (Signed)
 KNEE ARTHROPLASTY ADMISSION H&P  Patient ID: Jorge [name] MRN: [id number] DOB/AGE: [birth year] 53 y.o.  Chief Complaint: left knee pain.  Planned Procedure Date: 09/26/24 Medical Clearance by Dr. Hosie   Cardiac Clearance by Josefa Beauvais, NP  HPI: Jorge [name] is a 53 y.o. male who presents for evaluation of Primary osteoarthritis of left knee. The patient has a history of pain and functional disability in the left knee due to arthritis and has failed non-surgical conservative treatments for greater than 12 weeks to include NSAID's and/or analgesics, corticosteriod injections, and activity modification.  Onset of symptoms was abrupt, starting 5 years ago with gradually worsening course since that time. The patient noted no past surgery on the left knee.  Patient currently rates pain to be severe with activity. Patient has worsening of pain with activity and weight bearing and pain that interferes with activities of daily living.  Patient has evidence of joint space narrowing by imaging studies.  There is no active infection.  Past Medical History:  Diagnosis Date   CAD (coronary artery disease), native coronary artery    Coronary CTA showed mild nonobstructive CAD 25 to 49% stenosis in the mid RCA.   Diabetes mellitus type 2, controlled (HCC)    GERD (gastroesophageal reflux disease) 04/05/2012   Hiatal hernia 04/05/2012   Hyperlipidemia 12/01/2012   HE PREFERS TO USE NIACIN AND BRAN TO LOWER HIS TRIGLYCERIDES AND CHOLESTEROL   Obesity    OSA on CPAP 04/05/2012   Testosterone deficiency    Past Surgical History:  Procedure Laterality Date   CHOLECYSTECTOMY     FINGER SURGERY     HERNIA REPAIR     LEG SURGERY Right    WISDOM TOOTH EXTRACTION     Allergies  Allergen Reactions   Iodinated Contrast Media Hives   Prior to Admission medications   Medication Sig Start Date End Date Taking? Authorizing Provider  aspirin 81 MG chewable tablet Chew 81 mg by mouth daily.   Yes  [provider]  esomeprazole (NEXIUM) 20 MG capsule Take 20 mg by mouth in the morning and at bedtime.   Yes [provider]  gabapentin (NEURONTIN) 300 MG capsule Take 300 mg by mouth 2 (two) times daily.   Yes [provider]  ibuprofen (ADVIL,MOTRIN) 800 MG tablet Take 800 mg by mouth See admin instructions. Take 800 mg by mouth in the morning and evening and may take an additional 400 mg in the afternoon if needed for pain   Yes [provider]  metFORMIN (GLUCOPHAGE-XR) 500 MG 24 hr tablet Take 1,000 mg by mouth 2 (two) times daily. 03/26/22  Yes [provider]  MOUNJARO 12.5 MG/0.5ML Pen Inject 12.5 mg into the skin once a week.   Yes [provider]  NON FORMULARY Pt uses a c-pap nightly   Yes [provider]  rosuvastatin  (CRESTOR ) 10 MG tablet TAKE 1 TABLET BY MOUTH EVERY DAY 11/02/23  Yes Turner, Wilbert SAUNDERS, MD  testosterone cypionate (DEPOTESTOSTERONE CYPIONATE) 200 MG/ML injection Inject 100 mg into the muscle every 7 (seven) days.   Yes [provider]  TRESIBA FLEXTOUCH 200 UNIT/ML FlexTouch Pen Inject 70 Units into the skin at bedtime. 03/04/22  Yes [provider]  CINNAMON PO Take 1 tablet by mouth daily.    [provider]  diphenhydrAMINE  (BENADRYL ) 50 MG tablet Take tablet by mouth 1 hour prior to your cardiac MRI. Patient not taking: Reported on 09/15/2024 01/27/24   Shlomo Wilbert SAUNDERS, MD  GARLIC PO Take 1 tablet by mouth See admin instructions. Take 1 tablet by mouth daily every other month.    [provider]  NIACIN CR PO Take 1 tablet by mouth See admin instructions. Take 1 tablet by mouth daily every other month.    [provider]  OAT BRAN PO Take 1 capsule by mouth 2 (two) times daily.    [provider]  Omega-3 Fatty Acids (FISH OIL) 1000 MG CAPS Take 1,000 mg by mouth daily.    [provider]  predniSONE  (DELTASONE ) 50 MG tablet Take tablet by mouth  13 hours, 7 hours, 1 hour prior to your cardiac MRI. Patient not taking: Reported on 09/15/2024 01/27/24   Shlomo Wilbert SAUNDERS, MD  SYRINGE-NEEDLE, DISP, 3 ML 23G X 1 3 ML MISC 1 each by Does not apply route. 1 EACH BY DOES NOT APPLY ROUTE EVERY 10 DAYS    [provider]   Social History   Socioeconomic History   Marital status: Married    Spouse name: Not on file   Number of children: Not on file   Years of education: Not on file   Highest education level: Not on file  Occupational History   Not on file  Tobacco Use   Smoking status: Former    Current packs/day: 0.00    Types: Cigarettes    Quit date: 09/22/2012    Years since quitting: 11.9   Smokeless tobacco: Never  Vaping Use   Vaping status: Never Used  Substance and Sexual Activity   Alcohol use: No   Drug use: No   Sexual activity: Not on file  Other Topics Concern   Not on file  Social History Narrative   Not on file   Social Drivers of Health   Financial Resource Strain: Patient Declined (09/05/2024)   Overall Financial Resource Strain (CARDIA)    Difficulty of Paying Living Expenses: Patient declined  Food Insecurity: Patient Declined (09/05/2024)   Hunger Vital Sign    Worried About Running Out of Food in the Last Year: Patient declined    Ran Out of Food in the Last Year: Patient declined  Transportation Needs: Patient Declined (09/05/2024)   PRAPARE - Administrator, Civil Service (Medical): Patient declined    Lack of Transportation (Non-Medical): Patient declined  Physical Activity: Insufficiently Active (09/05/2024)   Exercise Vital Sign    Days of Exercise per Week: 1 day    Minutes of Exercise per Session: 10 min  Stress: Stress Concern Present (09/05/2024)   [employer] of Occupational Health - Occupational Stress Questionnaire    Feeling of Stress: To some extent  Social Connections: Moderately Integrated (09/05/2024)   Social Connection and Isolation Panel    Frequency  of Communication with Friends and Family: Never    Frequency of Social Gatherings with Friends and Family: Once a week    Attends Religious Services: More than 4 times per year    Active Member of [employer] or Organizations: Yes    Attends Banker Meetings: 1 to 4 times per year    Marital Status: Married   Family History  Problem Relation Age of Onset   Alzheimer's disease Mother    Valvular heart disease Father    Valvular heart disease Brother     ROS: Currently denies lightheadedness, dizziness, Fever, chills, CP, SOB.   No personal history of DVT, PE, MI, or CVA. No loose teeth. + dentures All other systems have been  reviewed and were otherwise currently negative with the exception of those mentioned in the HPI and as above.  Objective: Vitals: Ht: 6' ft Wt: 248 lbs BMI 33.6 Temp: 98.6 BP: 133/88 Pulse: 94 O2 94% on room air.   Physical Exam: General: Alert, NAD.  Antalgic Gait  HEENT: EOMI, Good Neck Extension  Pulm: No increased work of breathing.   CV: RRR, N GI: soft, NT, ND Neuro: Neuro without gross focal deficit.  Sensation intact distally Skin: No lesions in the area of chief complaint MSK/Surgical Site: Left knee 0-110 degrees of ROM. varus alignment. + pseudolaxity. + MJLT. Dorsiflexion and plantarflexion intact at the ankle. Distal sensation intact. + PT pulse.    Imaging Review Plain radiographs demonstrate severe degenerative joint disease of the left knee.   Preoperative templating of the joint replacement has been completed, documented, and submitted to the Operating Room personnel in order to optimize intra-operative equipment management.  Assessment: Left anteromedial knee osteoarthritis   Plan: Plan for Procedure(s): ARTHROPLASTY, KNEE, UNICOMPARTMENTAL  The patient history, physical exam, clinical judgement of the provider and imaging are consistent with end stage degenerative joint disease and unicompartmental joint arthroplasty is deemed  medically necessary. The treatment options including medical management, injection therapy, and arthroplasty were discussed at length. The risks and benefits of Procedure(s): ARTHROPLASTY, KNEE, UNICOMPARTMENTAL were presented and reviewed.  The risks of nonoperative treatment, versus surgical intervention including but not limited to continued pain, aseptic loosening, stiffness, dislocation/subluxation, infection, bleeding, nerve injury, blood clots, cardiopulmonary complications, morbidity, mortality, among others were discussed. The patient verbalizes understanding and wishes to proceed with the plan.  Patient is being admitted for inpatient treatment for surgery, pain control, PT, prophylactic antibiotics, VTE prophylaxis, progressive ambulation, ADL's and discharge planning.   The patient does meet the criteria for TXA which will be used perioperatively.   Xarelto will be used postoperatively for DVT prophylaxis in addition to SCDs, and early ambulation. The patient is planning to be discharged home with OPPT in care of wife   Army MARLA Delores DEVONNA 09/18/2024 4:19 PM

## 2024-09-18 NOTE — Patient Instructions (Signed)
 SURGICAL WAITING ROOM VISITATION  Patients having surgery or a procedure may have no more than 2 support people in the waiting area - these visitors may rotate.    Children under the age of 71 must have an adult with them who is not the patient.  Visitors with respiratory illnesses are discouraged from visiting and should remain at home.  If the patient needs to stay at the hospital during part of their recovery, the visitor guidelines for inpatient rooms apply. Pre-op nurse will coordinate an appropriate time for 1 support person to accompany patient in pre-op.  This support person may not rotate.    Please refer to the [facility] website for the visitor guidelines for Inpatients (after your surgery is over and you are in a regular room).    Your procedure is scheduled on: 09/26/24   Report to [facility] Main Entrance    Report to admitting at 5:15 AM   Call this number if you have problems the morning of surgery [phone]   Do not eat food :After Midnight.   After Midnight you may have the following liquids until 4:30 AM DAY OF SURGERY  Water Non-Citrus Juices (without pulp, NO RED-Apple, White grape, White cranberry) Black Coffee (NO MILK/CREAM OR CREAMERS, sugar ok)  Clear Tea (NO MILK/CREAM OR CREAMERS, sugar ok) regular and decaf                             Plain Jell-O (NO RED)                                           Fruit ices (not with fruit pulp, NO RED)                                     Popsicles (NO RED)                                                               Sports drinks like Gatorade (NO RED)                The day of surgery:  Drink ONE (1) Pre-Surgery G2 at 4:30 AM the morning of surgery. Drink in one sitting. Do not sip.  This drink was given to you during your hospital  pre-op appointment visit. Nothing else to drink after completing the  Pre-Surgery G2.          If you have questions, please contact your surgeon's  office.   FOLLOW BOWEL PREP AND ANY ADDITIONAL PRE OP INSTRUCTIONS YOU RECEIVED FROM YOUR SURGEON'S OFFICE!!!     Oral Hygiene is also important to reduce your risk of infection.                                    Remember - BRUSH YOUR TEETH THE MORNING OF SURGERY WITH YOUR REGULAR TOOTHPASTE  DENTURES WILL BE REMOVED PRIOR TO SURGERY PLEASE DO NOT APPLY Poly grip OR ADHESIVES!!!  Stop all vitamins and herbal supplements 7 days before surgery.   Take these medicines the morning of surgery with A SIP OF WATER: Nexium, Gabapentin, Rosuvastatin    DO NOT TAKE ANY ORAL DIABETIC MEDICATIONS DAY OF YOUR SURGERY  How to Manage Your Diabetes Before and After Surgery  Why is it important to control my blood sugar before and after surgery? Improving blood sugar levels before and after surgery helps healing and can limit problems. A way of improving blood sugar control is eating a healthy diet by:  Eating less sugar and carbohydrates  Increasing activity/exercise  Talking with your doctor about reaching your blood sugar goals High blood sugars (greater than 180 mg/dL) can raise your risk of infections and slow your recovery, so you will need to focus on controlling your diabetes during the weeks before surgery. Make sure that the doctor who takes care of your diabetes knows about your planned surgery including the date and location.  How do I manage my blood sugar before surgery? Check your blood sugar at least 4 times a day, starting 2 days before surgery, to make sure that the level is not too high or low. Check your blood sugar the morning of your surgery when you wake up and every 2 hours until you get to the Short Stay unit. If your blood sugar is less than 70 mg/dL, you will need to treat for low blood sugar: Do not take insulin. Treat a low blood sugar (less than 70 mg/dL) with  cup of clear juice (cranberry or apple), 4 glucose tablets, OR glucose gel. Recheck blood sugar in 15  minutes after treatment (to make sure it is greater than 70 mg/dL). If your blood sugar is not greater than 70 mg/dL on recheck, call [phone] for further instructions. Report your blood sugar to the short stay nurse when you get to Short Stay.  If you are admitted to the hospital after surgery: Your blood sugar will be checked by the staff and you will probably be given insulin after surgery (instead of oral diabetes medicines) to make sure you have good blood sugar levels. The goal for blood sugar control after surgery is 80-180 mg/dL.   WHAT DO I DO ABOUT MY DIABETES MEDICATION?  Do not take oral diabetes medicines (pills) the morning of surgery.  DO not take Mounjaro after 09/18/24.  THE DAY BEFORE SURGERY, take Metformin as prescribed. Take 50% of Tresiba.    THE MORNING OF SURGERY, do not take Metformin or Tresiba.  DO NOT TAKE THE FOLLOWING 7 DAYS PRIOR TO SURGERY: Ozempic, Wegovy, Rybelsus (Semaglutide), Byetta (exenatide), Bydureon (exenatide ER), Victoza, Saxenda (liraglutide), or Trulicity (dulaglutide) Mounjaro (Tirzepatide) Adlyxin (Lixisenatide), Polyethylene Glycol Loxenatide.   Reviewed and Endorsed by [facility] Patient Education Committee, August 2015  Bring CPAP mask and tubing day of surgery.                              You may not have any metal on your body including jewelry, and body piercing             Do not wear lotions, powders, cologne, or deodorant              Men may shave face and neck.   Do not bring valuables to the hospital. [facility] IS NOT             RESPONSIBLE   FOR VALUABLES.   Contacts,  glasses, dentures or bridgework may not be worn into surgery.  DO NOT BRING YOUR HOME MEDICATIONS TO THE HOSPITAL. PHARMACY WILL DISPENSE MEDICATIONS LISTED ON YOUR MEDICATION LIST TO YOU DURING YOUR ADMISSION IN THE HOSPITAL!    Patients discharged on the day of surgery will not be allowed to drive home.  Someone NEEDS to stay with you for the  first 24 hours after anesthesia.              Please read over the following fact sheets you were given: IF YOU HAVE QUESTIONS ABOUT YOUR PRE-OP INSTRUCTIONS PLEASE CALL [phone].   If you received a COVID test during your pre-op visit  it is requested that you wear a mask when out in public, stay away from anyone that may not be feeling well and notify your surgeon if you develop symptoms. If you test positive for Covid or have been in contact with anyone that has tested positive in the last 10 days please notify you surgeon.      Pre-operative 4 CHG Bath Instructions  DYNA-Hex 4 Chlorhexidine Gluconate 4% Solution Antiseptic 4 fl. oz   You can play a key role in reducing the risk of infection after surgery. Your skin needs to be as free of germs as possible. You can reduce the number of germs on your skin by washing with CHG (chlorhexidine gluconate) soap before surgery. CHG is an antiseptic soap that kills germs and continues to kill germs even after washing.   DO NOT use if you have an allergy to chlorhexidine/CHG or antibacterial soaps. If your skin becomes reddened or irritated, stop using the CHG and notify one of our RNs at   Please shower with the CHG soap starting 4 days before surgery using the following schedule:     Please keep in mind the following:  DO NOT shave, including legs and underarms, starting the day of your first shower.   You may shave your face at any point before/day of surgery.  Place clean sheets on your bed the day you start using CHG soap. Use a clean washcloth (not used since being washed) for each shower. DO NOT sleep with pets once you start using the CHG.  CHG Shower Instructions:  If you choose to wash your hair and private area, wash first with your normal shampoo/soap.  After you use shampoo/soap, rinse your hair and body thoroughly to remove shampoo/soap residue.  Turn the water OFF and apply about 3 tablespoons (45 ml) of CHG soap to  a CLEAN washcloth.  Apply CHG soap ONLY FROM YOUR NECK DOWN TO YOUR TOES (washing for 3-5 minutes)  DO NOT use CHG soap on face, private areas, open wounds, or sores.  Pay special attention to the area where your surgery is being performed.  If you are having back surgery, having someone wash your back for you may be helpful. Wait 2 minutes after CHG soap is applied, then you may rinse off the CHG soap.  Pat dry with a clean towel  Put on clean clothes/pajamas   If you choose to wear lotion, please use ONLY the CHG-compatible lotions on the back of this paper.     Additional instructions for the day of surgery: DO NOT APPLY any lotions, deodorants, cologne, or perfumes.   Put on clean/comfortable clothes.  Brush your teeth.  Ask your nurse before applying any prescription medications to the skin.   CHG Compatible Lotions   Aveeno Moisturizing lotion  Cetaphil Moisturizing  Cream  Cetaphil Moisturizing Lotion  Clairol Herbal Essence Moisturizing Lotion, Dry Skin  Clairol Herbal Essence Moisturizing Lotion, Extra Dry Skin  Clairol Herbal Essence Moisturizing Lotion, Normal Skin  Curel Age Defying Therapeutic Moisturizing Lotion with Alpha Hydroxy  Curel Extreme Care Body Lotion  Curel Soothing Hands Moisturizing Hand Lotion  Curel Therapeutic Moisturizing Cream, Fragrance-Free  Curel Therapeutic Moisturizing Lotion, Fragrance-Free  Curel Therapeutic Moisturizing Lotion, Original Formula  Eucerin Daily Replenishing Lotion  Eucerin Dry Skin Therapy Plus Alpha Hydroxy Crme  Eucerin Dry Skin Therapy Plus Alpha Hydroxy Lotion  Eucerin Original Crme  Eucerin Original Lotion  Eucerin Plus Crme Eucerin Plus Lotion  Eucerin TriLipid Replenishing Lotion  Keri Anti-Bacterial Hand Lotion  Keri Deep Conditioning Original Lotion Dry Skin Formula Softly Scented  Keri Deep Conditioning Original Lotion, Fragrance Free Sensitive Skin Formula  Keri Lotion Fast Absorbing Fragrance Free Sensitive  Skin Formula  Keri Lotion Fast Absorbing Softly Scented Dry Skin Formula  Keri Original Lotion  Keri Skin Renewal Lotion Keri Silky Smooth Lotion  Keri Silky Smooth Sensitive Skin Lotion  Nivea Body Creamy Conditioning Oil  Nivea Body Extra Enriched Lotion  Nivea Body Original Lotion  Nivea Body Sheer Moisturizing Lotion Nivea Crme  Nivea Skin Firming Lotion  NutraDerm 30 Skin Lotion  NutraDerm Skin Lotion  NutraDerm Therapeutic Skin Cream  NutraDerm Therapeutic Skin Lotion  ProShield Protective Hand Cream  Provon moisturizing lotion  View Pre-Surgery Education Videos:  [email or web address]     Incentive Spirometer  An incentive spirometer is a tool that can help keep your lungs clear and active. This tool measures how well you are filling your lungs with each breath. Taking long deep breaths may help reverse or decrease the chance of developing breathing (pulmonary) problems (especially infection) following: A long period of time when you are unable to move or be active. BEFORE THE PROCEDURE  If the spirometer includes an indicator to show your best effort, your nurse or respiratory therapist will set it to a desired goal. If possible, sit up straight or lean slightly forward. Try not to slouch. Hold the incentive spirometer in an upright position. INSTRUCTIONS FOR USE  Sit on the edge of your bed if possible, or sit up as far as you can in bed or on a chair. Hold the incentive spirometer in an upright position. Breathe out normally. Place the mouthpiece in your mouth and seal your lips tightly around it. Breathe in slowly and as deeply as possible, raising the piston or the ball toward the top of the column. Hold your breath for 3-5 seconds or for as long as possible. Allow the piston or ball to fall to the bottom of the column. Remove the mouthpiece from your mouth and breathe out normally. Rest for a few seconds  and repeat Steps 1 through 7 at least 10 times every 1-2 hours when you are awake. Take your time and take a few normal breaths between deep breaths. The spirometer may include an indicator to show your best effort. Use the indicator as a goal to work toward during each repetition. After each set of 10 deep breaths, practice coughing to be sure your lungs are clear. If you have an incision (the cut made at the time of surgery), support your incision when coughing by placing a pillow or rolled up towels firmly against it. Once you are able to get out of bed, walk around indoors and cough well. You may stop using the incentive spirometer when instructed by your caregiver.  RISKS AND COMPLICATIONS Take your time so you do not get dizzy or light-headed. If you are in pain, you may need to take or ask for pain medication before doing incentive spirometry. It is harder to take a deep breath if you are having pain. AFTER USE Rest and breathe slowly and easily. It can be helpful to keep track of a log of your progress. Your caregiver can provide you with a simple table to help with this. If you are using the spirometer at home, follow these instructions: SEEK MEDICAL CARE IF:  You are having difficultly using the spirometer. You have trouble using the spirometer as often as instructed. Your pain medication is not giving enough relief while using the spirometer. You develop fever of 100.5 F (38.1 C) or higher. SEEK IMMEDIATE MEDICAL CARE IF:  You cough up bloody sputum that had not been present before. You develop fever of 102 F (38.9 C) or greater. You develop worsening pain at or near the incision site. MAKE SURE YOU:  Understand these instructions. Will watch your condition. Will get help right away if you are not doing well or get worse. Document Released: 03/15/2007 Document Revised: 01/25/2012 Document Reviewed: 05/16/2007 [facility] Patient Information 2014 [place],  [place].   ________________________________________________________________________

## 2024-09-19 ENCOUNTER — Other Ambulatory Visit: Payer: Self-pay

## 2024-09-19 ENCOUNTER — Encounter (HOSPITAL_COMMUNITY): Payer: Self-pay

## 2024-09-19 ENCOUNTER — Encounter (HOSPITAL_COMMUNITY)
Admission: RE | Admit: 2024-09-19 | Discharge: 2024-09-19 | Disposition: A | Discharge status: Home / Self Care | Source: Ambulatory Visit | Attending: Orthopedic Surgery | Admitting: Orthopedic Surgery

## 2024-09-19 VITALS — BP 130/96 | HR 99 | Temp 98.0°F | Resp 16 | Ht 72.0 in | Wt 243.0 lb

## 2024-09-19 DIAGNOSIS — Z01812 Encounter for preprocedural laboratory examination: Secondary | ICD-10-CM | POA: Insufficient documentation

## 2024-09-19 DIAGNOSIS — Z794 Long term (current) use of insulin: Secondary | ICD-10-CM | POA: Insufficient documentation

## 2024-09-19 DIAGNOSIS — Z7985 Long-term (current) use of injectable non-insulin antidiabetic drugs: Secondary | ICD-10-CM | POA: Diagnosis not present

## 2024-09-19 DIAGNOSIS — G4733 Obstructive sleep apnea (adult) (pediatric): Secondary | ICD-10-CM | POA: Diagnosis not present

## 2024-09-19 DIAGNOSIS — Z01818 Encounter for other preprocedural examination: Secondary | ICD-10-CM | POA: Diagnosis present

## 2024-09-19 DIAGNOSIS — I2510 Atherosclerotic heart disease of native coronary artery without angina pectoris: Secondary | ICD-10-CM | POA: Insufficient documentation

## 2024-09-19 DIAGNOSIS — Z87891 Personal history of nicotine dependence: Secondary | ICD-10-CM | POA: Insufficient documentation

## 2024-09-19 DIAGNOSIS — M1712 Unilateral primary osteoarthritis, left knee: Secondary | ICD-10-CM | POA: Diagnosis not present

## 2024-09-19 DIAGNOSIS — Z7984 Long term (current) use of oral hypoglycemic drugs: Secondary | ICD-10-CM | POA: Diagnosis not present

## 2024-09-19 DIAGNOSIS — E119 Type 2 diabetes mellitus without complications: Secondary | ICD-10-CM | POA: Diagnosis not present

## 2024-09-19 HISTORY — DX: Unspecified osteoarthritis, unspecified site: M19.90

## 2024-09-19 HISTORY — DX: Other complications of anesthesia, initial encounter: T88.59XA

## 2024-09-19 HISTORY — DX: Family history of other specified conditions: Z84.89

## 2024-09-19 HISTORY — DX: Complex regional pain syndrome i of unspecified lower limb: G90.529

## 2024-09-19 LAB — BASIC METABOLIC PANEL WITH GFR
Anion gap: 11 (ref 5–15)
BUN: 17 mg/dL (ref 6–20)
CO2: 24 mmol/L (ref 22–32)
Calcium: 9.9 mg/dL (ref 8.9–10.3)
Chloride: 103 mmol/L (ref 98–111)
Creatinine, Ser: 0.97 mg/dL (ref 0.61–1.24)
GFR, Estimated: >60 mL/min (ref >60)
Glucose, Bld: 109 mg/dL — ABNORMAL HIGH (ref 70–99)
Potassium: 4.5 mmol/L (ref 3.5–5.1)
Sodium: 139 mmol/L (ref 135–145)

## 2024-09-19 LAB — SURGICAL PCR SCREEN
MRSA, PCR: NEGATIVE
Staphylococcus aureus: NEGATIVE

## 2024-09-19 LAB — CBC
HCT: 47.9 % (ref 39.0–52.0)
Hemoglobin: 15.7 g/dL (ref 13.0–17.0)
MCH: 30.7 pg (ref 26.0–34.0)
MCHC: 32.8 g/dL (ref 30.0–36.0)
MCV: 93.6 fL (ref 80.0–100.0)
Platelets: 199 K/uL (ref 150–400)
RBC: 5.12 MIL/uL (ref 4.22–5.81)
RDW: 13.2 % (ref 11.5–15.5)
WBC: 7 K/uL (ref 4.0–10.5)
nRBC: 0 % (ref 0.0–0.2)

## 2024-09-19 LAB — GLUCOSE, CAPILLARY: Glucose-Capillary: 139 mg/dL — ABNORMAL HIGH (ref 70–99)

## 2024-09-20 NOTE — Anesthesia Preprocedure Evaluation (Addendum)
 Anesthesia Evaluation  Patient identified by MRN, date of birth, ID band Patient awake    Reviewed: Allergy & Precautions, NPO status , Patient's Chart, lab work & pertinent test results  History of Anesthesia Complications Negative for: history of anesthetic complications  Airway Mallampati: III  TM Distance: <3 FB Neck ROM: Full    Dental  (+) Upper Dentures, Lower Dentures   Pulmonary sleep apnea , neg COPD, Patient abstained from smoking.Not current smoker, former smoker   Pulmonary exam normal breath sounds clear to auscultation       Cardiovascular Exercise Tolerance: Good METS(-) hypertension+ CAD  (-) Past MI (-) dysrhythmias  Rhythm:Regular Rate:Normal - Systolic murmurs 1. Left ventricular ejection fraction, by estimation, is 45 to 50%. The  left ventricle has mildly decreased function. The left ventricle  demonstrates global hypokinesis. Left ventricular diastolic parameters are  consistent with Grade I diastolic  dysfunction (impaired relaxation). The average left ventricular global  longitudinal strain is -17.6 %. The global longitudinal strain is  abnormal.   2. Right ventricular systolic function is normal. The right ventricular  size is normal. Tricuspid regurgitation signal is inadequate for assessing  PA pressure.   3. The mitral valve is normal in structure. No evidence of mitral valve  regurgitation. No evidence of mitral stenosis.   4. The aortic valve is tricuspid. Aortic valve regurgitation is not  visualized. No aortic stenosis is present.   5. Aortic dilatation noted. There is mild dilatation of the aortic root,  measuring 38 mm. There is mild dilatation of the ascending aorta,  measuring 38 mm.   6. The inferior vena cava is normal in size with <50% respiratory  variability, suggesting right atrial pressure of 8 mmHg.    Deemed acceptable risk by cardiologist   Neuro/Psych negative  neurological ROS  negative psych ROS   GI/Hepatic hiatal hernia,GERD  Medicated and Controlled,,(+)     (-) substance abuse    Endo/Other  diabetes, Well Controlled, Type 2, Insulin Dependent  GLP1ra last taken > 7 days ago. Denies GI symptoms today  Renal/GU negative Renal ROS     Musculoskeletal  (+) Arthritis ,    Abdominal  (+) + obese  Peds  Hematology Denies blood thinner use or bleeding disorders.    Anesthesia Other Findings Denies blood thinner use or bleeding diatheses. Recent labs reviewed. Past Medical History: No date: Arthritis No date: CAD (coronary artery disease), native coronary artery     Comment:  Coronary CTA showed mild nonobstructive CAD 25 to 49%               stenosis in the mid RCA. 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 No date: Testosterone deficiency   Reproductive/Obstetrics                              Anesthesia Physical Anesthesia Plan  ASA: 3  Anesthesia Plan: Spinal   Post-op Pain Management: Tylenol PO (pre-op)* and Regional block*   Induction: Intravenous  PONV Risk Score and Plan: 1 and Ondansetron, Dexamethasone, Propofol infusion, TIVA and Midazolam  Airway Management Planned: Natural Airway  Additional Equipment: None  Intra-op Plan:   Post-operative Plan:   Informed Consent: I have reviewed the patients History and Physical, chart, labs and discussed the procedure including the risks, benefits and  alternatives for the proposed anesthesia with the patient or authorized representative who has indicated his/her understanding and acceptance.       Plan Discussed with: CRNA and Surgeon  Anesthesia Plan Comments: (Discussed R/B/A of neuraxial anesthesia technique with patient: - rare risks of spinal/epidural hematoma, nerve damage, infection - Risk of PDPH - Risk of nausea and vomiting - Risk of conversion to general anesthesia and its associated risks, including sore throat, damage to lips/eyes/teeth/oropharynx, and rare risks such as cardiac and respiratory events. - Risk of allergic reactions  Discussed r/b/a of adductor canal nerve block, including:  - bleeding, infection, nerve damage - poor or non functioning block. - reactions and toxicity to local anesthetic Patient understands.  Discussed the role of CRNA in patient's perioperative care.  Patient voiced understanding.)         Anesthesia Quick Evaluation

## 2024-09-20 NOTE — Progress Notes (Signed)
 Anesthesia Chart Review  Case: [id number] Date/Time: 09/26/24 0715   Procedure: ARTHROPLASTY, KNEE, UNICOMPARTMENTAL (Left: Knee)   Anesthesia type: Spinal   Diagnosis: Primary osteoarthritis of left knee [M17.12]   Pre-op diagnosis: Primary osteoarthritis of left knee   Location: [facility] ROOM 07 / [facility]   Surgeons: Josefina Chew, MD       DISCUSSION:53 y.o. former smoker with h/o OSA on CPAP, mild nonobstructive CAD, DM II, left knee OA scheduled for above procedure 09/26/24 with Dr. Chew Josefina.   Per cardiology preoperative evaluation 09/18/2024, According to the Revised Cardiac Risk Index (RCRI), his Perioperative Risk of Major Cardiac Event is (%): 6.6. His Functional Capacity in METs is: 6.61 according to the Duke Activity Status Index (DASI). The patient is doing well from a cardiac perspective. Therefore, based on ACC/AHA guidelines, the patient would be at acceptable risk for the planned procedure without further cardiovascular testing.    Ideally aspirin should be continued without interruption, however if the bleeding risk is too great, aspirin may be held for 5-7 days prior to surgery. Please resume aspirin post operatively when it is felt to be safe from a bleeding standpoint.  Clearance received from PCP which states pt is optimized for procedure.  Pt advised to hold Mounjaro 1 week prior to procedure.   VS: BP (!) 130/96   Pulse 99   Temp 36.7 C (Oral)   Resp 16   Ht 6' (1.829 m)   Wt 110.2 kg   SpO2 98%   BMI 32.96 kg/m   PROVIDERS: [facility], Nena, NP is PCP    LABS: Labs reviewed: Acceptable for surgery. (all labs ordered are listed, but only abnormal results are displayed)  Labs Reviewed  BASIC METABOLIC PANEL WITH GFR - Abnormal; Notable for the following components:      Result Value   Glucose, Bld 109 (*)    All other components within normal limits  GLUCOSE, CAPILLARY - Abnormal; Notable for the following components:   Glucose-Capillary  139 (*)    All other components within normal limits  SURGICAL PCR SCREEN  CBC     IMAGES:   EKG:   CV: Echo 12/16/2023  1. Left ventricular ejection fraction, by estimation, is 45 to 50%. The  left ventricle has mildly decreased function. The left ventricle  demonstrates global hypokinesis. Left ventricular diastolic parameters are  consistent with Grade I diastolic  dysfunction (impaired relaxation). The average left ventricular global  longitudinal strain is -17.6 %. The global longitudinal strain is  abnormal.   2. Right ventricular systolic function is normal. The right ventricular  size is normal. Tricuspid regurgitation signal is inadequate for assessing  PA pressure.   3. The mitral valve is normal in structure. No evidence of mitral valve  regurgitation. No evidence of mitral stenosis.   4. The aortic valve is tricuspid. Aortic valve regurgitation is not  visualized. No aortic stenosis is present.   5. Aortic dilatation noted. There is mild dilatation of the aortic root,  measuring 38 mm. There is mild dilatation of the ascending aorta,  measuring 38 mm.   6. The inferior vena cava is normal in size with <50% respiratory  variability, suggesting right atrial pressure of 8 mmHg.  Past Medical History:  Diagnosis Date   Arthritis    CAD (coronary artery disease), native coronary artery    Coronary CTA showed mild nonobstructive CAD 25 to 49% stenosis in the mid RCA.   Complication of anesthesia  woke up early during dental surgery- tried to run away   Diabetes mellitus type 2, controlled (HCC)    Family history of adverse reaction to anesthesia    strong family history of MVP   GERD (gastroesophageal reflux disease) 04/05/2012   Hiatal hernia 04/05/2012   Hyperlipidemia 12/01/2012   HE PREFERS TO USE NIACIN AND BRAN TO LOWER HIS TRIGLYCERIDES AND CHOLESTEROL   Obesity    OSA on CPAP 04/05/2012   RSD lower limb    Testosterone deficiency     Past Surgical  History:  Procedure Laterality Date   CHOLECYSTECTOMY     COLONOSCOPY     FINGER SURGERY     HERNIA REPAIR     x2   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   WISDOM TOOTH EXTRACTION      MEDICATIONS:  aspirin 81 MG chewable tablet   CINNAMON PO   diphenhydrAMINE  (BENADRYL ) 50 MG tablet   esomeprazole (NEXIUM) 20 MG capsule   gabapentin (NEURONTIN) 300 MG capsule   GARLIC PO   ibuprofen (ADVIL,MOTRIN) 800 MG tablet   metFORMIN (GLUCOPHAGE-XR) 500 MG 24 hr tablet   MOUNJARO 12.5 MG/0.5ML Pen   NIACIN CR PO   NON FORMULARY   OAT BRAN PO   Omega-3 Fatty Acids (FISH OIL) 1000 MG CAPS   predniSONE  (DELTASONE ) 50 MG tablet   rosuvastatin  (CRESTOR ) 10 MG tablet   SYRINGE-NEEDLE, DISP, 3 ML 23G X 1 3 ML MISC   testosterone cypionate (DEPOTESTOSTERONE CYPIONATE) 200 MG/ML injection   TRESIBA FLEXTOUCH 200 UNIT/ML FlexTouch Pen   No current facility-administered medications for this encounter.    Harlene Hoots Ward, PA-C [facility]-Surgical Testing [phone]

## 2024-09-26 ENCOUNTER — Ambulatory Visit (HOSPITAL_COMMUNITY): Admitting: Anesthesiology

## 2024-09-26 ENCOUNTER — Other Ambulatory Visit: Payer: Self-pay

## 2024-09-26 ENCOUNTER — Ambulatory Visit (HOSPITAL_COMMUNITY)

## 2024-09-26 ENCOUNTER — Encounter (HOSPITAL_COMMUNITY): Payer: Self-pay | Admitting: Orthopedic Surgery

## 2024-09-26 ENCOUNTER — Encounter (HOSPITAL_COMMUNITY)
Admission: RE | Disposition: A | Discharge status: Home / Self Care | Payer: Self-pay | Source: Home / Self Care | Attending: Orthopedic Surgery

## 2024-09-26 ENCOUNTER — Ambulatory Visit (HOSPITAL_COMMUNITY): Payer: Self-pay | Admitting: Physician Assistant

## 2024-09-26 ENCOUNTER — Ambulatory Visit (HOSPITAL_COMMUNITY)
Admission: RE | Admit: 2024-09-26 | Discharge: 2024-09-26 | Disposition: A | Discharge status: Home / Self Care | Attending: Orthopedic Surgery | Admitting: Orthopedic Surgery

## 2024-09-26 DIAGNOSIS — G4733 Obstructive sleep apnea (adult) (pediatric): Secondary | ICD-10-CM | POA: Insufficient documentation

## 2024-09-26 DIAGNOSIS — I2510 Atherosclerotic heart disease of native coronary artery without angina pectoris: Secondary | ICD-10-CM | POA: Diagnosis not present

## 2024-09-26 DIAGNOSIS — M1990 Unspecified osteoarthritis, unspecified site: Secondary | ICD-10-CM | POA: Diagnosis not present

## 2024-09-26 DIAGNOSIS — M1712 Unilateral primary osteoarthritis, left knee: Secondary | ICD-10-CM

## 2024-09-26 DIAGNOSIS — Z7984 Long term (current) use of oral hypoglycemic drugs: Secondary | ICD-10-CM | POA: Diagnosis not present

## 2024-09-26 DIAGNOSIS — K449 Diaphragmatic hernia without obstruction or gangrene: Secondary | ICD-10-CM | POA: Insufficient documentation

## 2024-09-26 DIAGNOSIS — K219 Gastro-esophageal reflux disease without esophagitis: Secondary | ICD-10-CM | POA: Diagnosis not present

## 2024-09-26 DIAGNOSIS — Z794 Long term (current) use of insulin: Secondary | ICD-10-CM | POA: Insufficient documentation

## 2024-09-26 DIAGNOSIS — Z7985 Long-term (current) use of injectable non-insulin antidiabetic drugs: Secondary | ICD-10-CM | POA: Diagnosis not present

## 2024-09-26 DIAGNOSIS — E119 Type 2 diabetes mellitus without complications: Secondary | ICD-10-CM | POA: Diagnosis not present

## 2024-09-26 DIAGNOSIS — Z87891 Personal history of nicotine dependence: Secondary | ICD-10-CM | POA: Diagnosis not present

## 2024-09-26 HISTORY — PX: PARTIAL KNEE ARTHROPLASTY: SHX2174

## 2024-09-26 LAB — GLUCOSE, CAPILLARY
Glucose-Capillary: 76 mg/dL (ref 70–99)
Glucose-Capillary: 72 mg/dL (ref 70–99)

## 2024-09-26 LAB — HEMOGLOBIN A1C
Hgb A1c MFr Bld: 7.2 % — ABNORMAL HIGH (ref 4.8–5.6)
Mean Plasma Glucose: 159.94 mg/dL

## 2024-09-26 SURGERY — ARTHROPLASTY, KNEE, UNICOMPARTMENTAL
Anesthesia: Spinal | Site: Knee | Laterality: Left

## 2024-09-26 MED ORDER — TRANEXAMIC ACID-NACL 1000-0.7 MG/100ML-% IV SOLN
1000.0000 mg | Freq: Once | INTRAVENOUS | Status: AC
  Administered 2024-09-26: 1000 mg via INTRAVENOUS
  Filled 2024-09-26: qty 100

## 2024-09-26 MED ORDER — BUPIVACAINE HCL (PF) 0.25 % IJ SOLN
INTRAMUSCULAR | Status: AC
Start: 2024-09-26 — End: 2024-09-26
  Filled 2024-09-26: qty 30

## 2024-09-26 MED ORDER — PROPOFOL 500 MG/50ML IV EMUL
INTRAVENOUS | Status: DC | PRN
  Administered 2024-09-26: 50 ug/kg/min via INTRAVENOUS

## 2024-09-26 MED ORDER — POVIDONE-IODINE 7.5 % EX SOLN: Freq: Once | CUTANEOUS | Status: DC

## 2024-09-26 MED ORDER — ONDANSETRON HCL 4 MG/2ML IJ SOLN
INTRAMUSCULAR | Status: AC
  Filled 2024-09-26: qty 2

## 2024-09-26 MED ORDER — DROPERIDOL 2.5 MG/ML IJ SOLN: 0.6250 mg | Freq: Once | INTRAMUSCULAR | Status: DC | PRN

## 2024-09-26 MED ORDER — KETOROLAC TROMETHAMINE 30 MG/ML IJ SOLN
INTRAMUSCULAR | Status: AC
  Filled 2024-09-26: qty 1

## 2024-09-26 MED ORDER — DEXMEDETOMIDINE HCL IN NACL 80 MCG/20ML IV SOLN
INTRAVENOUS | Status: DC | PRN
  Administered 2024-09-26: 8 ug via INTRAVENOUS
  Administered 2024-09-26: 4 ug via INTRAVENOUS

## 2024-09-26 MED ORDER — KETOROLAC TROMETHAMINE 30 MG/ML IJ SOLN
INTRAMUSCULAR | Status: DC | PRN
Start: 2024-09-26 — End: 2024-09-26
  Administered 2024-09-26: 30 mg

## 2024-09-26 MED ORDER — DEXAMETHASONE SOD PHOSPHATE PF 10 MG/ML IJ SOLN
INTRAMUSCULAR | Status: DC | PRN
  Administered 2024-09-26: 4 mg via INTRAVENOUS

## 2024-09-26 MED ORDER — LACTATED RINGERS IV BOLUS: 250.0000 mL | Freq: Once | INTRAVENOUS | Status: DC

## 2024-09-26 MED ORDER — LIDOCAINE HCL (CARDIAC) PF 100 MG/5ML IV SOSY
PREFILLED_SYRINGE | INTRAVENOUS | Status: DC | PRN
  Administered 2024-09-26: 40 mg via INTRAVENOUS

## 2024-09-26 MED ORDER — ORAL CARE MOUTH RINSE: 15.0000 mL | Freq: Once | OROMUCOSAL | Status: AC

## 2024-09-26 MED ORDER — FENTANYL CITRATE (PF) 100 MCG/2ML IJ SOLN
INTRAMUSCULAR | Status: AC
  Filled 2024-09-26: qty 2

## 2024-09-26 MED ORDER — FENTANYL CITRATE (PF) 100 MCG/2ML IJ SOLN
INTRAMUSCULAR | Status: DC | PRN
  Administered 2024-09-26 (×2): 50 ug via INTRAVENOUS

## 2024-09-26 MED ORDER — KETAMINE HCL 50 MG/5ML IJ SOSY
PREFILLED_SYRINGE | INTRAMUSCULAR | Status: AC
Start: 2024-09-26 — End: 2024-09-26
  Filled 2024-09-26: qty 5

## 2024-09-26 MED ORDER — BUPIVACAINE IN DEXTROSE 0.75-8.25 % IT SOLN
INTRATHECAL | Status: DC | PRN
  Administered 2024-09-26: 1.8 mL via INTRATHECAL

## 2024-09-26 MED ORDER — PHENYLEPHRINE HCL-NACL 20-0.9 MG/250ML-% IV SOLN
INTRAVENOUS | Status: DC | PRN
  Administered 2024-09-26: 20 ug/min via INTRAVENOUS

## 2024-09-26 MED ORDER — KETAMINE HCL 10 MG/ML IJ SOLN
INTRAMUSCULAR | Status: DC | PRN
  Administered 2024-09-26: 20 mg via INTRAVENOUS

## 2024-09-26 MED ORDER — ACETAMINOPHEN 500 MG PO TABS
1000.0000 mg | ORAL_TABLET | Freq: Once | ORAL | Status: AC
  Administered 2024-09-26: 1000 mg via ORAL
  Filled 2024-09-26: qty 2

## 2024-09-26 MED ORDER — ONDANSETRON HCL 4 MG/2ML IJ SOLN
INTRAMUSCULAR | Status: DC | PRN
Start: 2024-09-26 — End: 2024-09-26
  Administered 2024-09-26: 4 mg via INTRAVENOUS

## 2024-09-26 MED ORDER — 0.9 % SODIUM CHLORIDE (POUR BTL) OPTIME
TOPICAL | Status: DC | PRN
Start: 2024-09-26 — End: 2024-09-26
  Administered 2024-09-26: 1000 mL

## 2024-09-26 MED ORDER — LACTATED RINGERS IV BOLUS
500.0000 mL | Freq: Once | INTRAVENOUS | Status: AC
  Administered 2024-09-26: 500 mL via INTRAVENOUS

## 2024-09-26 MED ORDER — SENNA-DOCUSATE SODIUM 8.6-50 MG PO TABS: 2.0000 | ORAL_TABLET | Freq: Every day | ORAL | 1 refills | Status: AC

## 2024-09-26 MED ORDER — OXYCODONE HCL 5 MG PO TABS: 5.0000 mg | ORAL_TABLET | Freq: Once | ORAL | Status: DC | PRN

## 2024-09-26 MED ORDER — SODIUM CHLORIDE 0.9 % IR SOLN
Status: DC | PRN
  Administered 2024-09-26: 1000 mL

## 2024-09-26 MED ORDER — BUPIVACAINE HCL (PF) 0.25 % IJ SOLN
INTRAMUSCULAR | Status: DC | PRN
  Administered 2024-09-26: 20 mL via PERINEURAL

## 2024-09-26 MED ORDER — MIDAZOLAM HCL (PF) 2 MG/2ML IJ SOLN
0.5000 mg | INTRAMUSCULAR | Status: AC
  Administered 2024-09-26: 2 mg via INTRAVENOUS
  Filled 2024-09-26: qty 2

## 2024-09-26 MED ORDER — BUPIVACAINE HCL 0.25 % IJ SOLN
INTRAMUSCULAR | Status: DC | PRN
  Administered 2024-09-26: 30 mL

## 2024-09-26 MED ORDER — OXYCODONE HCL 5 MG PO TABS: 5.0000 mg | ORAL_TABLET | ORAL | 0 refills | Status: AC | PRN

## 2024-09-26 MED ORDER — STERILE WATER FOR IRRIGATION IR SOLN
Status: DC | PRN
Start: 2024-09-26 — End: 2024-09-26
  Administered 2024-09-26: 2000 mL

## 2024-09-26 MED ORDER — FENTANYL CITRATE (PF) 50 MCG/ML IJ SOSY: 25.0000 ug | PREFILLED_SYRINGE | INTRAMUSCULAR | Status: DC | PRN

## 2024-09-26 MED ORDER — CHLORHEXIDINE GLUCONATE 0.12 % MT SOLN
15.0000 mL | Freq: Once | OROMUCOSAL | Status: AC
  Administered 2024-09-26: 15 mL via OROMUCOSAL

## 2024-09-26 MED ORDER — CEFAZOLIN SODIUM-DEXTROSE 2-4 GM/100ML-% IV SOLN
2.0000 g | INTRAVENOUS | Status: AC
  Administered 2024-09-26: 2 g via INTRAVENOUS
  Filled 2024-09-26: qty 100

## 2024-09-26 MED ORDER — RIVAROXABAN 10 MG PO TABS: 10.0000 mg | ORAL_TABLET | Freq: Every day | ORAL | 0 refills | Status: AC

## 2024-09-26 MED ORDER — BACLOFEN 10 MG PO TABS: 10.0000 mg | ORAL_TABLET | Freq: Three times a day (TID) | ORAL | 0 refills | Status: AC

## 2024-09-26 MED ORDER — POVIDONE-IODINE 10 % EX SWAB: 2.0000 | Freq: Once | CUTANEOUS | Status: DC

## 2024-09-26 MED ORDER — OXYCODONE HCL 5 MG/5ML PO SOLN: 5.0000 mg | Freq: Once | ORAL | Status: DC | PRN

## 2024-09-26 MED ORDER — LIDOCAINE HCL (PF) 2 % IJ SOLN
INTRAMUSCULAR | Status: AC
  Filled 2024-09-26: qty 5

## 2024-09-26 MED ORDER — LACTATED RINGERS IV SOLN: INTRAVENOUS | Status: DC

## 2024-09-26 MED ORDER — METHOCARBAMOL 500 MG PO TABS
500.0000 mg | ORAL_TABLET | Freq: Four times a day (QID) | ORAL | Status: DC | PRN
Start: 2024-09-26 — End: 2024-09-26

## 2024-09-26 MED ORDER — METHOCARBAMOL 1000 MG/10ML IJ SOLN: 500.0000 mg | Freq: Four times a day (QID) | INTRAMUSCULAR | Status: DC | PRN

## 2024-09-26 MED ORDER — MIDAZOLAM HCL 2 MG/2ML IJ SOLN
INTRAMUSCULAR | Status: AC
  Filled 2024-09-26: qty 2

## 2024-09-26 MED ORDER — ACETAMINOPHEN 500 MG PO TABS: 1000.0000 mg | ORAL_TABLET | Freq: Once | ORAL | Status: DC

## 2024-09-26 MED ORDER — FENTANYL CITRATE (PF) 50 MCG/ML IJ SOSY
25.0000 ug | PREFILLED_SYRINGE | INTRAMUSCULAR | Status: DC
Start: 2024-09-26 — End: 2024-09-26

## 2024-09-26 MED ORDER — INSULIN ASPART 100 UNIT/ML IJ SOLN: 0.0000 [IU] | INTRAMUSCULAR | Status: DC | PRN

## 2024-09-26 MED ORDER — PROPOFOL 10 MG/ML IV BOLUS
INTRAVENOUS | Status: AC
  Filled 2024-09-26: qty 20

## 2024-09-26 MED ORDER — ONDANSETRON HCL 4 MG PO TABS: 4.0000 mg | ORAL_TABLET | Freq: Three times a day (TID) | ORAL | 0 refills | Status: AC | PRN

## 2024-09-26 MED ORDER — CEFAZOLIN SODIUM-DEXTROSE 2-4 GM/100ML-% IV SOLN: 2.0000 g | Freq: Four times a day (QID) | INTRAVENOUS | Status: DC

## 2024-09-26 MED ORDER — TRANEXAMIC ACID-NACL 1000-0.7 MG/100ML-% IV SOLN
1000.0000 mg | INTRAVENOUS | Status: DC
  Filled 2024-09-26: qty 100

## 2024-09-26 MED ORDER — DEXTROSE 50 % IV SOLN
INTRAVENOUS | Status: AC
  Filled 2024-09-26: qty 50

## 2024-09-26 MED ORDER — MIDAZOLAM HCL 5 MG/5ML IJ SOLN
INTRAMUSCULAR | Status: DC | PRN
  Administered 2024-09-26: .5 mg via INTRAVENOUS
  Administered 2024-09-26: 1 mg via INTRAVENOUS
  Administered 2024-09-26: .5 mg via INTRAVENOUS

## 2024-09-26 SURGICAL SUPPLY — 49 items
BAG COUNTER SPONGE SURGICOUNT (BAG) IMPLANT
BAG ZIPLOCK 12X15 (MISCELLANEOUS) ×1 IMPLANT
BEARING MENISCAL TIBIAL 5 MD L (Orthopedic Implant) IMPLANT
BLADE SURG 15 STRL LF DISP TIS (BLADE) ×1 IMPLANT
BNDG ELASTIC 6X15 VLCR STRL LF (GAUZE/BANDAGES/DRESSINGS) ×1 IMPLANT
BOWL SMART MIX CTS (DISPOSABLE) ×1 IMPLANT
CEMENT HV SMART SET (Cement) IMPLANT
CLSR STERI-STRIP ANTIMIC 1/2X4 (GAUZE/BANDAGES/DRESSINGS) ×1 IMPLANT
COVER SURGICAL LIGHT HANDLE (MISCELLANEOUS) ×1 IMPLANT
CUFF TRNQT CYL 34X4.125X (TOURNIQUET CUFF) ×1 IMPLANT
DERMABOND ADVANCED .7 DNX12 (GAUZE/BANDAGES/DRESSINGS) IMPLANT
DRAPE U-SHAPE 47X51 STRL (DRAPES) ×1 IMPLANT
DRSG MEPILEX POST OP 4X12 (GAUZE/BANDAGES/DRESSINGS) ×1 IMPLANT
DURAPREP 26ML APPLICATOR (WOUND CARE) ×2 IMPLANT
ELECT PENCIL ROCKER SW 15FT (MISCELLANEOUS) ×1 IMPLANT
ELECT REM PT RETURN 15FT ADLT (MISCELLANEOUS) ×1 IMPLANT
GAUZE PAD ABD 8X10 STRL (GAUZE/BANDAGES/DRESSINGS) ×2 IMPLANT
GLOVE BIO SURGEON STRL SZ 6.5 (GLOVE) ×1 IMPLANT
GLOVE BIOGEL PI IND STRL 7.0 (GLOVE) ×1 IMPLANT
GLOVE BIOGEL PI IND STRL 8 (GLOVE) ×1 IMPLANT
GLOVE ORTHO TXT STRL SZ7.5 (GLOVE) ×1 IMPLANT
GOWN STRL SURGICAL XL XLNG (GOWN DISPOSABLE) ×2 IMPLANT
HOLDER FOLEY CATH W/STRAP (MISCELLANEOUS) IMPLANT
HOOD PEEL AWAY T7 (MISCELLANEOUS) ×1 IMPLANT
IMMOBILIZER KNEE 20 THIGH 36 (SOFTGOODS) IMPLANT
IMMOBILIZER KNEE 22 UNIV (SOFTGOODS) ×1 IMPLANT
KIT TURNOVER KIT A (KITS) ×1 IMPLANT
NDL SAFETY ECLIPSE 18X1.5 (NEEDLE) ×1 IMPLANT
NS IRRIG 1000ML POUR BTL (IV SOLUTION) ×1 IMPLANT
PACK BLADE SAW RECIP 70 3 PT (BLADE) ×1 IMPLANT
PACK TOTAL KNEE CUSTOM (KITS) ×1 IMPLANT
PEG TWIN FEM CEMENTED MED (Knees) IMPLANT
PROTECTOR NERVE ULNAR (MISCELLANEOUS) ×1 IMPLANT
SET HNDPC FAN SPRY TIP SCT (DISPOSABLE) ×1 IMPLANT
SET PAD KNEE POSITIONER (MISCELLANEOUS) IMPLANT
SPIKE FLUID TRANSFER (MISCELLANEOUS) IMPLANT
SUCTION TUBE FRAZIER 12FR DISP (SUCTIONS) ×1 IMPLANT
SUT STRATAFIX PDS+ 0 24IN (SUTURE) ×1 IMPLANT
SUT VIC AB 1 CT1 36 (SUTURE) ×1 IMPLANT
SUT VIC AB 2-0 CT1 TAPERPNT 27 (SUTURE) ×2 IMPLANT
SUT VIC AB 3-0 SH 27X BRD (SUTURE) ×2 IMPLANT
SUTURE STRATFX MNCRL+ 3-0 PS-2 (SUTURE) ×1 IMPLANT
SYR 3ML LL SCALE MARK (SYRINGE) ×1 IMPLANT
TOWEL OR 17X26 10 PK STRL BLUE (TOWEL DISPOSABLE) ×1 IMPLANT
TRAY FOLEY MTR SLVR 16FR STAT (SET/KITS/TRAYS/PACK) IMPLANT
TRAY TIBIAL OXFORD SZ D LF (Joint) IMPLANT
TUBE SUCTION HIGH CAP CLEAR NV (SUCTIONS) ×1 IMPLANT
WATER STERILE IRR 1000ML POUR (IV SOLUTION) ×1 IMPLANT
WRAP KNEE MAXI GEL POST OP (GAUZE/BANDAGES/DRESSINGS) ×1 IMPLANT

## 2024-09-26 NOTE — Anesthesia Procedure Notes (Signed)
 Anesthesia Regional Block: Adductor canal block   Pre-Anesthetic Checklist: , timeout performed,  Correct Patient, Correct Site, Correct Laterality,  Correct Procedure, Correct Position, site marked,  Risks and benefits discussed,  Surgical consent,  Pre-op evaluation,  At surgeon's request and post-op pain management  Laterality: Lower and Left  Prep: chloraprep       Needles:  Injection technique: Single-shot  Needle Type: Echogenic Needle     Needle Length: 9cm  Needle Gauge: 21     Additional Needles:   Procedures:,,,, ultrasound used (permanent image in chart),,    Narrative:  Start time: 09/26/2024 9:23 AM End time: 09/26/2024 9:25 AM Injection made incrementally with aspirations every 5 mL.  Performed by: Personally  Anesthesiologist: Boone Fess, MD  Additional Notes: Patient's chart reviewed and they were deemed appropriate candidate for procedure, per surgeon's request. Patient educated about risks, benefits, and alternatives of the block including but not limited to: temporary or permanent nerve damage, bleeding, infection, damage to surround tissues, block failure, local anesthetic toxicity. Patient expressed understanding. A formal time-out was conducted consistent with institution rules.  Monitors were applied, and minimal sedation used (see nursing record). The site was prepped with skin prep and allowed to dry, and sterile gloves were used. A high frequency linear ultrasound probe with probe cover was utilized throughout. Femoral artery visualized at mid-thigh level, local anesthetic injected anterolateral to it, and echogenic block needle trajectory was monitored throughout. Hydrodissection of saphenous nerve visualized and appeared anatomically normal. Aspiration performed every 5ml. Blood vessels were avoided. All injections were performed without resistance and free of blood and paresthesias. The patient tolerated the procedure well.  Injectate: 20ml 0.25%  bupivacaine

## 2024-09-26 NOTE — Evaluation (Signed)
 Physical Therapy Evaluation Patient Details Name: Jorge [name] MRN: [id number] DOB: 28-Nov-1970 Today's Date: 09/26/2024  History of Present Illness  53 yo male presents to therapy s/p L unicompartmental knee arthoscopy on 09/26/2024 due to failure of conservative measures. Pt PMH includes but is not limited to: CAD, GERD, DM II, HLD, OSA on CPAP, and R LE surgery.  Clinical Impression      Jorge [name] is a 53 y.o. male POD 0 s/p L anterio-medial knee arthroscopy. Patient reports IND with mobility at baseline. Patient is now limited by functional impairments (see PT problem list below) and requires CGA for transfers and gait with RW. Patient was able to ambulate 50 and 40 feet with RW and CGA and cues for safe walker management. Patient educated on safe sequencing for stair mobility with use of RW to navigate steps to enter home, fall risk prevention, use of RW, pain management and goal, ice man machine, slowly increasing activity levels, car transfers pt and spouse verbalized understanding of safe guarding position for people assisting with mobility. Patient instructed in exercises to facilitate ROM and circulation reviewed and HO provided. Patient will benefit from continued skilled PT interventions to address impairments and progress towards PLOF. Patient has met mobility goals at adequate level for discharge home with family support and OPPT services; will continue to follow if pt continues acute stay to progress towards Mod I goals.     If plan is discharge home, recommend the following: A little help with walking and/or transfers;A little help with bathing/dressing/bathroom;Assistance with cooking/housework;Assist for transportation;Help with stairs or ramp for entrance   Can travel by private vehicle        Equipment Recommendations None recommended by PT  Recommendations for Other Services       Functional Status Assessment Patient has had a recent decline in their functional  status and demonstrates the ability to make significant improvements in function in a reasonable and predictable amount of time.     Precautions / Restrictions Precautions Precautions: Knee;Fall Restrictions Weight Bearing Restrictions Per Provider Order: No      Mobility  Bed Mobility Overal bed mobility: Needs Assistance Bed Mobility: Supine to Sit     Supine to sit: Contact guard     General bed mobility comments: HOB slightly elevated, min cues and CGA for L LE    Transfers Overall transfer level: Needs assistance Equipment used: Rolling walker (2 wheels) Transfers: Sit to/from Stand Sit to Stand: Contact guard assist           General transfer comment: min cues    Ambulation/Gait Ambulation/Gait assistance: Contact guard assist Gait Distance (Feet): 50 Feet Assistive device: Rolling walker (2 wheels) Gait Pattern/deviations: Step-to pattern, Decreased stance time - left, Antalgic, Trunk flexed Gait velocity: decreased     General Gait Details: slight trunk flexion with B UE support at Rw to offload L LE in stance phase min cues for safety, RW management and progressing with OPPT toward use of SPC and or no AD  Stairs Stairs: Yes Stairs assistance: Contact guard assist Stair Management: Two rails Number of Stairs: 3 General stair comments: min cues for safety, sequencing step to pattern with B handrails and verbal instruction/ed provided to pt and spouse on use of Rw for step navigation as required in home setting  Wheelchair Mobility     Tilt Bed    Modified Rankin (Stroke Patients Only)       Balance Overall balance assessment: Needs assistance Sitting-balance support: Feet supported  Sitting balance-Leahy Scale: Good     Standing balance support: Bilateral upper extremity supported, During functional activity, Reliant on assistive device for balance Standing balance-Leahy Scale: Fair Standing balance comment: static standing no UE support                              Pertinent Vitals/Pain Pain Assessment Pain Assessment: 0-10 Pain Score: 3  Pain Location: L knee and LE Pain Descriptors / Indicators: Throbbing, Sore, Discomfort, Dull Pain Intervention(s): Limited activity within patient's tolerance, Monitored during session, Premedicated before session, Repositioned, Ice applied    Home Living Family/patient expects to be discharged to:: Private residence Living Arrangements: Spouse/significant other;Children Available Help at Discharge: Family Type of Home: House Home Access: Stairs to enter Entrance Stairs-Rails: None Entrance Stairs-Number of Steps: 2   Home Layout: One level Home Equipment: [employer] (2 wheels);Grab bars - toilet;Shower seat;Cane - single point Additional Comments: ice man machine, bed rail and lift chair    Prior Function Prior Level of Function : Independent/Modified Independent;Driving;Working/employed             Mobility Comments: IND no AD for all ADLs, self care tasks and IADLs       Extremity/Trunk Assessment             Cervical / Trunk Assessment Cervical / Trunk Assessment: Normal  Communication   Communication Communication: No apparent difficulties    Cognition Arousal: Alert Behavior During Therapy: WFL for tasks assessed/performed   PT - Cognitive impairments: No apparent impairments                         Following commands: Intact       Cueing       General Comments      Exercises Total Joint Exercises Ankle Circles/Pumps: AROM, Both, 10 reps Quad Sets: AROM, Left, 10 reps Short Arc Quad: AROM, Left, 5 reps Heel Slides: AROM, Left, 5 reps Hip ABduction/ADduction: AROM, Left, 5 reps Straight Leg Raises: AROM, Left, 5 reps Knee Flexion: AROM, Left, 5 reps, Seated   Assessment/Plan    PT Assessment Patient needs continued PT services  PT Problem List Decreased strength;Decreased range of motion;Decreased  balance;Decreased activity tolerance;Decreased mobility;Decreased coordination;Pain       PT Treatment Interventions DME instruction;Gait training;Stair training;Functional mobility training;Therapeutic activities;Therapeutic exercise;Balance training;Neuromuscular re-education;Patient/family education;Modalities    PT Goals (Current goals can be found in the Care Plan section)  Acute Rehab PT Goals Patient Stated Goal: 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, go to the [place] PT Goal Formulation: With patient Time For Goal Achievement: 10/10/24 Potential to Achieve Goals: Good    Frequency 7X/week     Co-evaluation               AM-PAC PT 6 Clicks Mobility  Outcome Measure Help needed turning from your back to your side while in a flat bed without using bedrails?: None Help needed moving from lying on your back to sitting on the side of a flat bed without using bedrails?: A Little Help needed moving to and from a bed to a chair (including a wheelchair)?: A Little Help needed standing up from a chair using your arms (e.g., wheelchair or bedside chair)?: A Little Help needed to walk in hospital room?: A Little Help needed climbing 3-5 steps with a railing? : A Little 6 Click Score: 19  End of Session Equipment Utilized During Treatment: Gait belt Activity Tolerance: No increased pain;Patient tolerated treatment well Patient left: in chair;with call bell/phone within reach;with family/visitor present Nurse Communication: Mobility status;Other (comment) (pt readiness from PT [facility] for same day d/c) PT Visit Diagnosis: Unsteadiness on feet (R26.81);Other abnormalities of gait and mobility (R26.89);Muscle weakness (generalized) (M62.81);Difficulty in walking, not elsewhere classified (R26.2);Pain Pain - Right/Left: Left Pain - part of body: Leg;Knee    Time: 8446-8345 PT Time Calculation (min) (ACUTE ONLY): 61 min   Charges:   PT Evaluation $PT Eval  Low Complexity: 1 Low PT Treatments $Gait Training: 8-22 mins $Therapeutic Exercise: 8-22 mins $Therapeutic Activity: 8-22 mins PT General Charges $$ ACUTE PT VISIT: 1 Visit         [place], PT Acute Rehab   [place] VE[place] Drone 09/26/2024, 5:33 PM

## 2024-09-26 NOTE — Interval H&P Note (Signed)
 History and Physical Interval Note:  09/26/2024 9:28 AM  Jorge [name]  has presented today for surgery, with the diagnosis of Primary osteoarthritis of left knee.  The various methods of treatment have been discussed with the patient and family. After consideration of risks, benefits and other options for treatment, the patient has consented to  Procedure(s): ARTHROPLASTY, KNEE, UNICOMPARTMENTAL (Left) as a surgical intervention.  The patient's history has been reviewed, patient examined, no change in status, stable for surgery.  I have reviewed the patient's chart and labs.  Questions were answered to the patient's satisfaction.     [name]

## 2024-09-26 NOTE — Discharge Instructions (Signed)
 INSTRUCTIONS AFTER JOINT REPLACEMENT   Remove items at home which could result in a fall. This includes throw rugs or furniture in walking pathways ICE to the affected joint every three hours while awake for 30 minutes at a time, for at least the first 3-5 days, and then as needed for pain and swelling.  Continue to use ice for pain and swelling. You may notice swelling that will progress down to the foot and ankle.  This is normal after surgery.  Elevate your leg when you are not up walking on it.   Continue to use the breathing machine you got in the hospital (incentive spirometer) which will help keep your temperature down.  It is common for your temperature to cycle up and down following surgery, especially at night when you are not up moving around and exerting yourself.  The breathing machine keeps your lungs expanded and your temperature down.   DIET:  As you were doing prior to hospitalization, we recommend a well-balanced diet.  DRESSING / WOUND CARE / SHOWERING  You may shower 3 days after surgery, but keep the wounds dry during showering.  You may use an occlusive plastic wrap (Press'n Seal for example), NO SOAKING/SUBMERGING IN THE BATHTUB.  If the bandage gets wet, change with a clean dry gauze.  If the incision gets wet, pat the wound dry with a clean towel. Please remove the ace wrap around your leg 24 hours after surgery. After this is removed, please use your compression stockings during the day for the next 2 weeks. You may remove them at night.   ACTIVITY  Increase activity slowly as tolerated, but follow the weight bearing instructions below.   No driving for 6 weeks or until further direction given by your physician.  You cannot drive while taking narcotics.  No lifting or carrying greater than 10 lbs. until further directed by your surgeon. Avoid periods of inactivity such as sitting longer than an hour when not asleep. This helps prevent blood clots.  You may return to work  once you are authorized by your doctor.     WEIGHT BEARING   Weight bearing as tolerated with assist device (walker, cane, etc) as directed, use it as long as suggested by your surgeon or therapist, typically at least 4-6 weeks.   EXERCISES  Results after joint replacement surgery are often greatly improved when you follow the exercise, range of motion and muscle strengthening exercises prescribed by your doctor. Safety measures are also important to protect the joint from further injury. Any time any of these exercises cause you to have increased pain or swelling, decrease what you are doing until you are comfortable again and then slowly increase them. If you have problems or questions, call your caregiver or physical therapist for advice.   Rehabilitation is important following a joint replacement. After just a few days of immobilization, the muscles of the leg can become weakened and shrink (atrophy).  These exercises are designed to build up the tone and strength of the thigh and leg muscles and to improve motion. Often times heat used for twenty to thirty minutes before working out will loosen up your tissues and help with improving the range of motion but do not use heat for the first two weeks following surgery (sometimes heat can increase post-operative swelling).   These exercises can be done on a training (exercise) mat, on the floor, on a table or on a bed. Use whatever works the best and is  most comfortable for you.    Use music or television while you are exercising so that the exercises are a pleasant break in your day. This will make your life better with the exercises acting as a break in your routine that you can look forward to.   Perform all exercises about fifteen times, three times per day or as directed.  You should exercise both the operative leg and the other leg as well.  Exercises include:   Quad Sets - Tighten up the muscle on the front of the thigh (Quad) and hold for  5-10 seconds.   Straight Leg Raises - With your knee straight (if you were given a brace, keep it on), lift the leg to 60 degrees, hold for 3 seconds, and slowly lower the leg.  Perform this exercise against resistance later as your leg gets stronger.  Leg Slides: Lying on your back, slowly slide your foot toward your buttocks, bending your knee up off the floor (only go as far as is comfortable). Then slowly slide your foot back down until your leg is flat on the floor again.  Angel Wings: Lying on your back spread your legs to the side as far apart as you can without causing discomfort.  Hamstring Strength:  Lying on your back, push your heel against the floor with your leg straight by tightening up the muscles of your buttocks.  Repeat, but this time bend your knee to a comfortable angle, and push your heel against the floor.  You may put a pillow under the heel to make it more comfortable if necessary.   A rehabilitation program following joint replacement surgery can speed recovery and prevent re-injury in the future due to weakened muscles. Contact your doctor or a physical therapist for more information on knee rehabilitation.    CONSTIPATION  Constipation is defined medically as fewer than three stools per week and severe constipation as less than one stool per week.  Even if you have a regular bowel pattern at home, your normal regimen is likely to be disrupted due to multiple reasons following surgery.  Combination of anesthesia, postoperative narcotics, change in appetite and fluid intake all can affect your bowels.   YOU MUST use at least one of the following options; they are listed in order of increasing strength to get the job done.  They are all available over the counter, and you may need to use some, POSSIBLY even all of these options:    Drink plenty of fluids (prune juice may be helpful) and high fiber foods Colace 100 mg by mouth twice a day  Senokot for constipation as directed  and as needed Dulcolax (bisacodyl), take with full glass of water  Miralax (polyethylene glycol) once or twice a day as needed.  If you have tried all these things and are unable to have a bowel movement in the first 3-4 days after surgery call either your surgeon or your primary doctor.    If you experience loose stools or diarrhea, hold the medications until you stool forms back up.  If your symptoms do not get better within 1 week or if they get worse, check with your doctor.  If you experience the worst abdominal pain ever or develop nausea or vomiting, please contact the office immediately for further recommendations for treatment.   ITCHING:  If you experience itching with your medications, try taking only a single pain pill, or even half a pain pill at a time.  You  can also use Benadryl  over the counter for itching or also to help with sleep.   TED HOSE STOCKINGS:  Use stockings on both legs until for at least 2 weeks or as directed by physician office. They may be removed at night for sleeping.  MEDICATIONS:  See your medication summary on the "After Visit Summary" that nursing will review with you.  You may have some home medications which will be placed on hold until you complete the course of blood thinner medication.  It is important for you to complete the blood thinner medication as prescribed.  PRECAUTIONS:  If you experience chest pain or shortness of breath - call 911 immediately for transfer to the hospital emergency department.   If you develop a fever greater that 101 F, purulent drainage from wound, increased redness or drainage from wound, foul odor from the wound/dressing, or calf pain - CONTACT YOUR SURGEON.                                                   FOLLOW-UP APPOINTMENTS:  If you do not already have a post-op appointment, please call the office for an appointment to be seen by your surgeon.  Guidelines for how soon to be seen are listed in your "After Visit  Summary", but are typically between 1-4 weeks after surgery.  OTHER INSTRUCTIONS:   Knee Replacement:  Do not place pillow under knee, focus on keeping the knee straight while resting.   POST-OPERATIVE OPIOID TAPER INSTRUCTIONS: It is important to wean off of your opioid medication as soon as possible. If you do not need pain medication after your surgery it is ok to stop day one. Opioids include: Codeine, Hydrocodone(Norco, Vicodin), Oxycodone(Percocet, oxycontin) and hydromorphone amongst others.  Long term and even short term use of opiods can cause: Increased pain response Dependence Constipation Depression Respiratory depression And more.  Withdrawal symptoms can include Flu like symptoms Nausea, vomiting And more Techniques to manage these symptoms Hydrate well Eat regular healthy meals Stay active Use relaxation techniques(deep breathing, meditating, yoga) Do Not substitute Alcohol to help with tapering If you have been on opioids for less than two weeks and do not have pain than it is ok to stop all together.  Plan to wean off of opioids This plan should start within one week post op of your joint replacement. Maintain the same interval or time between taking each dose and first decrease the dose.  Cut the total daily intake of opioids by one tablet each day Next start to increase the time between doses. The last dose that should be eliminated is the evening dose.   MAKE SURE YOU:  Understand these instructions.  Get help right away if you are not doing well or get worse.    Thank you for letting us  be a part of your medical care team.  It is a privilege we respect greatly.  We hope these instructions will help you stay on track for a fast and full recovery!

## 2024-09-26 NOTE — Op Note (Signed)
 09/26/2024  11:58 AM  PATIENT:  Jorge [name]    PRE-OPERATIVE DIAGNOSIS: Left knee primary localized osteoarthritis anteromedial  POST-OPERATIVE DIAGNOSIS:  Same  PROCEDURE: LEFT unicompartmental Knee Arthroplasty  SURGEON:  [name], MD  PHYSICIAN ASSISTANT: Army Daring, PA-C, present and scrubbed throughout the case, critical for completion in a timely fashion, and for retraction, instrumentation, and closure.  ANESTHESIA:   Spinal  ESTIMATED BLOOD LOSS: 100 mL  UNIQUE ASPECTS OF THE CASE: Bone quality was good.  The ACL was intact.  There was a little bit of grade 2 changes in the femoral trochlea, but overall the chondral surfaces of the lateral compartment and patellofemoral joint looked excellent.  The medial side sized to a size medium, I debated about using the large because of his height, but the medium seemed to fit well.  I did cut off of the 2 mm shim, although it seemed a little bit deep, and the size 5 felt good throughout the case.  The tibia could have been a size C medial to lateral if I had removed a little bit of osteophyte, but front to back the size D matched him better, so I went with size D and left just a little bit of osteophyte.  He was probably slightly over hung posterior medially, but the D provided the best cortical support.  PREOPERATIVE INDICATIONS:  Jorge [name] is a  53 y.o. male with a diagnosis of anteromedial knee osteoarthritis who failed conservative measures and elected for surgical management.    The risks benefits and alternatives were discussed with the patient preoperatively including but not limited to the risks of infection, bleeding, nerve injury, cardiopulmonary complications, blood clots, the need for revision surgery, among others, and the patient was willing to proceed.  OPERATIVE IMPLANTS: Implant Name: CEMENT HV SMART SET - [id number] Type: Cement Inv. Item: CEMENT HV SMART SET Serial No.:  Manufacturer: [employer]  ORTHOPAEDICS Lot No.: [id number] LRB: Left No. Used: 1 Action: Implanted   Implant Name: TRAY TIBIAL [place]ORD SZ D LF - [id number] Type: Joint Inv. Item: TRAY TIBIAL OX[place]ORD SZ D LF Serial No.:  Manufacturer: [employer](ORTH,TRAU,BIO,SG) Lot No.: [id number] LRB: Left No. Used: 1 Action: Implanted   Implant Name: PEG TWIN FEM CEMENTED MED - [id number] Type: Knees Inv. Item: PEG TWIN FEM CEMENTED MED Serial No.:  Manufacturer: [employer](ORTH,TRAU,BIO,SG) Lot No.: [id number] LRB: Left No. Used: 1 Action: Implanted   Implant Name: BEARING MENISCAL TIBIAL 5 MD L - [id number] Type: Orthopedic Implant Inv. Item: BEARING MENISCAL TIBIAL 5 MD L Serial No.:  Manufacturer: [employer](ORTH,TRAU,BIO,SG) Lot No.: [id number] LRB: Left No. Used: 1 Action: Implanted   OPERATIVE FINDINGS: Endstage grade 4 medial compartment osteoarthritis. No significant changes in the lateral or patellofemoral joint.  The ACL was intact.  OPERATIVE PROCEDURE: The patient was brought to the operating room placed in the supine position. Anesthesia was administered. IV antibiotics were given. The lower extremity was placed in the legholder and prepped and draped in usual sterile fashion.  Time out was performed.  The leg was elevated and exsanguinated and the tourniquet was inflated to 200 mmHg. Anteromedial incision was performed, and I took care to preserve the MCL. Parapatellar incision was carried out, and the osteophytes were excised, along with the medial meniscus and a small portion of the fat pad.  The extra medullary tibial cutting jig was applied, using the spoon and the 4mm G-Clamp and the 2 mm shim, and I took care to protect the anterior cruciate  ligament insertion and the tibial spine. The medial collateral ligament was also protected, and I resected my proximal tibia, matching the anatomic slope.   The proximal tibial bony cut was removed in one piece, and I turned my attention to the  femur.  The intramedullary femoral rod was placed using the drill, and then using the appropriate reference, I assembled the femoral jig, setting my posterior cutting block. I resected my posterior femur, used the 0 spigot for the anterior femur, and then measured my gap.   I then used the appropriate mill to match the extension gap to the flexion gap. The second milling was at a 4 (5-1).  The gaps were then measured again with the appropriate feeler gauges. Once I had balanced flexion and extension gaps, I then completed the preparation of the femur.  I milled off the anterior aspect of the distal femur to prevent impingement. I also exposed the tibia, and selected the above-named component, and then used the cutting jig to prepare the keel slot on the tibia. I also used the awl to curette out the bone to complete the preparation of the keel. The back wall was intact.  I then placed trial components, and it was found to have excellent motion, and appropriate balance.  I then cemented the components into place, cementing the tibia first, removing all excess cement, and then cementing the femur.  All loose cement was removed.  The real polyethylene insert was applied manually, and the knee was taken through functional range of motion, and found to have excellent stability and restoration of joint motion, with excellent balance.  The wounds were irrigated copiously, and the parapatellar tissue closed with Stratafix and Vicryl, followed by Vicryl for the subcutaneous tissue, with routine closure with Steri-Strips and sterile gauze.  The tourniquet was released, and the patient was awakened and extubated and returned to PACU in stable and satisfactory condition. There were no complications.

## 2024-09-26 NOTE — Anesthesia Postprocedure Evaluation (Signed)
 Anesthesia Post Note  Patient: Jorge [name]  Procedure(s) Performed: ARTHROPLASTY, KNEE, UNICOMPARTMENTAL (Left: Knee)     Patient location during evaluation: PACU Anesthesia Type: Spinal Level of consciousness: oriented and awake and alert Pain management: pain level controlled Vital Signs Assessment: post-procedure vital signs reviewed and stable Respiratory status: spontaneous breathing, respiratory function stable and patient connected to nasal cannula oxygen Cardiovascular status: blood pressure returned to baseline and stable Postop Assessment: no headache, no backache and no apparent nausea or vomiting Anesthetic complications: no   No notable events documented.  Last Vitals:  Vitals:   09/26/24 1330 09/26/24 1345  BP: 105/65 105/72  Pulse: 78 78  Resp: (!) 21 14  Temp:    SpO2: 93% 92%    Last Pain:  Vitals:   09/26/24 1330  TempSrc:   PainSc: 0-No pain                 Rome Ade

## 2024-09-26 NOTE — Anesthesia Procedure Notes (Signed)
 Spinal  Patient location during procedure: OR Start time: 09/26/2024 10:23 AM End time: 09/26/2024 10:27 AM Reason for block: surgical anesthesia Staffing Performed: resident/CRNA  Resident/CRNA: Kathern Rollene LABOR, CRNA Performed by: Kathern Rollene LABOR, CRNA Authorized by: Kathern Rollene LABOR, CRNA   Preanesthetic Checklist Completed: patient identified, IV checked, site marked, risks and benefits discussed, surgical consent, monitors and equipment checked, pre-op evaluation and timeout performed Spinal Block Patient position: sitting Prep: ChloraPrep Patient monitoring: heart rate, cardiac monitor, continuous pulse ox and blood pressure Approach: midline Location: L3-4 Injection technique: single-shot Needle Needle type: Pencan  Needle gauge: 24 G Needle length: 9 cm Needle insertion depth: 6 cm Assessment Sensory level: T6 Events: CSF return

## 2024-09-26 NOTE — Transfer of Care (Signed)
 Immediate Anesthesia Transfer of Care Note  Patient: Jorge [name]  Procedure(s) Performed: ARTHROPLASTY, KNEE, UNICOMPARTMENTAL (Left: Knee)  Patient Location: PACU  Anesthesia Type:Spinal  Level of Consciousness: awake, alert , oriented, and patient cooperative  Airway & Oxygen Therapy: Patient Spontanous Breathing and Patient connected to face mask oxygen  Post-op Assessment: Report given to RN and Post -op Vital signs reviewed and stable  Post vital signs: Reviewed and stable  Last Vitals:  Vitals Value Taken Time  BP 122/87 09/26/24 12:35  Temp 36.2 C 09/26/24 12:35  Pulse 74 09/26/24 12:37  Resp 12 09/26/24 12:37  SpO2 99 % 09/26/24 12:37  Vitals shown include unfiled device data.  Last Pain:  Vitals:   09/26/24 1235  TempSrc:   PainSc: Asleep         Complications: No notable events documented.

## 2024-09-27 ENCOUNTER — Encounter (HOSPITAL_COMMUNITY): Payer: Self-pay | Admitting: Orthopedic Surgery

## 2024-09-29 ENCOUNTER — Ambulatory Visit: Attending: Orthopedic Surgery | Admitting: Physical Therapy

## 2024-09-29 ENCOUNTER — Other Ambulatory Visit: Payer: Self-pay

## 2024-09-29 ENCOUNTER — Encounter: Payer: Self-pay | Admitting: Physical Therapy

## 2024-09-29 DIAGNOSIS — R600 Localized edema: Secondary | ICD-10-CM | POA: Insufficient documentation

## 2024-09-29 DIAGNOSIS — M25562 Pain in left knee: Secondary | ICD-10-CM | POA: Diagnosis present

## 2024-09-29 DIAGNOSIS — M6281 Muscle weakness (generalized): Secondary | ICD-10-CM | POA: Insufficient documentation

## 2024-09-29 DIAGNOSIS — M25662 Stiffness of left knee, not elsewhere classified: Secondary | ICD-10-CM | POA: Insufficient documentation

## 2024-09-29 DIAGNOSIS — G8929 Other chronic pain: Secondary | ICD-10-CM | POA: Insufficient documentation

## 2024-09-29 NOTE — Therapy (Signed)
 OUTPATIENT PHYSICAL THERAPY LOWER EXTREMITY EVALUATION   Patient Name: Jorge [name] MRN: [id number] DOB:1971-01-16, 53 y.o., male Today's Date: 09/29/2024  END OF SESSION:  PT End of Session - 09/29/24 1103     Visit Number 1    Number of Visits 12    Date for Recertification  11/10/24    PT Start Time 0944    PT Stop Time 1036    PT Time Calculation (min) 52 min    Activity Tolerance Patient limited by pain    Behavior During Therapy [facility] for tasks assessed/performed          Past Medical History:  Diagnosis Date   Arthritis    CAD (coronary artery disease), native coronary artery    Coronary CTA showed mild nonobstructive CAD 25 to 49% stenosis in the mid RCA.   Complication of anesthesia    woke up early during dental surgery- tried to run away   Diabetes mellitus type 2, controlled (HCC)    Family history of adverse reaction to anesthesia    strong family history of MVP   GERD (gastroesophageal reflux disease) 04/05/2012   Hiatal hernia 04/05/2012   Hyperlipidemia 12/01/2012   HE PREFERS TO USE NIACIN AND BRAN TO LOWER HIS TRIGLYCERIDES AND CHOLESTEROL   Obesity    OSA on CPAP 04/05/2012   RSD lower limb    Testosterone deficiency    Past Surgical History:  Procedure Laterality Date   CHOLECYSTECTOMY     COLONOSCOPY     FINGER SURGERY     HERNIA REPAIR     x2   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   PARTIAL KNEE ARTHROPLASTY Left 09/26/2024   Procedure: ARTHROPLASTY, KNEE, UNICOMPARTMENTAL;  Surgeon: Josefina Chew, MD;  Location: [facility];  Service: Orthopedics;  Laterality: Left;   WISDOM TOOTH EXTRACTION     Patient Active Problem List   Diagnosis Date Noted   Chronic pain of left knee 11/01/2023   Encounter for general adult medical examination with abnormal findings 08/23/2023   Diabetes mellitus treated with insulin and oral medication (HCC) 08/23/2023   Screening PSA (prostate specific antigen) 08/23/2023   Long-term  current use of injectable noninsulin antidiabetic medication 08/23/2023   Diabetes mellitus, type 2 (HCC) 03/31/2022   Obesity 03/31/2022   Testosterone deficiency 03/31/2022   Ventral incisional hernia 03/26/2022   CAD (coronary artery disease), native coronary artery 02/05/2022   OSA (obstructive sleep apnea) 09/22/2017   Hyperlipidemia 12/01/2012   GERD (gastroesophageal reflux disease) 04/05/2012   Hiatal hernia 04/05/2012   Obstructive sleep apnea (adult) (pediatric) 04/05/2012   REFERRING PROVIDER: Chew Josefina MD  REFERRING DIAG: S/p left unilateral knee arthroplasty.    THERAPY DIAG:  Chronic pain of left knee  Localized edema  Muscle weakness (generalized)  Stiffness of left knee, not elsewhere classified  Rationale for Evaluation and Treatment: Rehabilitation  ONSET DATE: 09/26/24 (surgery date).    SUBJECTIVE:   SUBJECTIVE STATEMENT: The patient presents to the clinic s/p unilateral knee arthroplasty performed on 09/26/24.  He states he walked a lot after getting home and his knee hurt and is having trouble bending it now.  His pain is rated at an 8/10 today.  Rest, ice and medication decrease his pain.  Increased activity increases his pain.  He has been established an HEP from his hospital discharge.  He describes the pain as sharp.    PERTINENT HISTORY: See above.  Right LE surgery (1998). PAIN:  Are you  having pain? Yes: NPRS scale: 8/10.   Pain location: Left knee.   Pain description: Sharp.   Aggravating factors: As above.   Relieving factors: As above.    PRECAUTIONS: Other: No U/S.    RED FLAGS: None   WEIGHT BEARING RESTRICTIONS: No  FALLS:  Has patient fallen in last 6 months? No  LIVING ENVIRONMENT: Lives with: lives with their spouse Lives in: House/apartment Has following equipment at home: Vannie - 4 wheeled  PLOF: Independent  PATIENT GOALS: Perform ADL's without knee pain.    OBJECTIVE:   PATIENT SURVEYS:  LEFS:  10/50.     EDEMA:  Circumferential: Left 4 cms > right.  PALPATION: Diffuse left knee pain.  Post-surgical dressing intact.    LOWER EXTREMITY ROM:  In supine:  Left knee extension to -5 degrees and initial flexion to 40 degrees.  He achieved 55 degrees after 10 minutes on Nustep.    LOWER EXTREMITY MMT:  The patient is currently unable to perform an antigravity left SLR and SAQ.    GAIT: The patient is walking safely with a Rolator with very good heel strike.                                                                                                                                  TREATMENT DATE: Nustep level 1 x 10 minutes f/b LE elevation and vasopneumatic on low to left knee.      PATIENT EDUCATION:  Education details: Patient instructed to be compliant to his HEP. Person educated: Patient Education method: Explanation Education comprehension: verbalized understanding  HOME EXERCISE PROGRAM:   ASSESSMENT:  CLINICAL IMPRESSION: The patient presents to OPPT s/p unilateral knee arthroplasty performed on 09/26/24.  He lacks 5 degrees of extension in supine and flexion is significantly limited currently.  His is is currently unable to perform an antigravity left SLR and SAQ.  He demonstrates moderate edema.  He is walking safely with a Rolator.  His LEFS score is 10/80.  Patient will benefit from skilled physical therapy intervention to address pain and deficits.  OBJECTIVE IMPAIRMENTS: Abnormal gait, decreased activity tolerance, decreased mobility, decreased ROM, decreased strength, increased edema, and pain.   ACTIVITY LIMITATIONS: carrying, lifting, bending, standing, stairs, bed mobility, dressing, and locomotion level  PARTICIPATION LIMITATIONS: meal prep, cleaning, laundry, driving, shopping, community activity, occupation, and yard work  PERSONAL FACTORS: Time since onset of injury/illness/exacerbation are also affecting patient's functional outcome.   REHAB  POTENTIAL: Good  CLINICAL DECISION MAKING: Stable/uncomplicated  EVALUATION COMPLEXITY: Low   GOALS:  SHORT TERM GOALS: Target date: 10/13/24  Ind with an initial HEP. Goal status: INITIAL  2.  Full active left knee extension. Goal status: INITIAL  3.  Active left knee flexion to 90 degrees.  Goal status: INITIAL   LONG TERM GOALS: Target date: 11/10/24  Ind with an advanced HEP.  Goal status: INITIAL  2.  Active left knee flexion to  115 degrees+ so the patient can perform functional tasks and do so with pain not > 2-3/10.  Goal status: INITIAL  3.  Increase left hip and knee strength to a solid 4+/5 to provide good stability for accomplishment of functional activities. Goal status: INITIAL  4.  Perform a reciprocating stair gait with one railing with pain not > 2-3/10.  Goal status: INITIAL  5.  Improve LEFS score by at least 30 points.  Goal status: INITIAL  PLAN:  PT FREQUENCY: 2x/week  PT DURATION: 6 weeks  PLANNED INTERVENTIONS: 97110-Therapeutic exercises, 97530- Therapeutic activity, [id number]- Neuromuscular re-education, 97535- Self Care, 02859- Manual therapy, G0283- Electrical stimulation (unattended), 97016- Vasopneumatic device, Patient/Family education, and Cryotherapy  PLAN FOR NEXT SESSION: Nustep, PROM, VMS to quads PRN.  Vasopneumatic.     Dezire Turk, PT 09/29/2024, 2:02 PM

## 2024-10-03 ENCOUNTER — Encounter: Payer: Self-pay | Admitting: Physical Therapy

## 2024-10-03 ENCOUNTER — Encounter: Payer: Self-pay | Admitting: Cardiology

## 2024-10-03 ENCOUNTER — Ambulatory Visit: Admitting: Physical Therapy

## 2024-10-03 DIAGNOSIS — R600 Localized edema: Secondary | ICD-10-CM

## 2024-10-03 DIAGNOSIS — I2510 Atherosclerotic heart disease of native coronary artery without angina pectoris: Secondary | ICD-10-CM

## 2024-10-03 DIAGNOSIS — G8929 Other chronic pain: Secondary | ICD-10-CM

## 2024-10-03 DIAGNOSIS — G4733 Obstructive sleep apnea (adult) (pediatric): Secondary | ICD-10-CM

## 2024-10-03 DIAGNOSIS — M25662 Stiffness of left knee, not elsewhere classified: Secondary | ICD-10-CM

## 2024-10-03 DIAGNOSIS — M25562 Pain in left knee: Secondary | ICD-10-CM | POA: Diagnosis not present

## 2024-10-03 DIAGNOSIS — M6281 Muscle weakness (generalized): Secondary | ICD-10-CM

## 2024-10-03 NOTE — Therapy (Signed)
 OUTPATIENT PHYSICAL THERAPY LOWER EXTREMITY EVALUATION   Patient Name: Jorge [name] MRN: [id number] DOB:December 28, 1970, 53 y.o., male Today's Date: 10/03/2024  END OF SESSION:  PT End of Session - 10/03/24 1657     Visit Number 2    Number of Visits 12    Date for Recertification  11/10/24    PT Start Time 0448    PT Stop Time 0544    PT Time Calculation (min) 56 min    Activity Tolerance Patient limited by pain    Behavior During Therapy [facility] for tasks assessed/performed          Past Medical History:  Diagnosis Date   Arthritis    CAD (coronary artery disease), native coronary artery    Coronary CTA showed mild nonobstructive CAD 25 to 49% stenosis in the mid RCA.   Complication of anesthesia    woke up early during dental surgery- tried to run away   Diabetes mellitus type 2, controlled (HCC)    Family history of adverse reaction to anesthesia    strong family history of MVP   GERD (gastroesophageal reflux disease) 04/05/2012   Hiatal hernia 04/05/2012   Hyperlipidemia 12/01/2012   HE PREFERS TO USE NIACIN AND BRAN TO LOWER HIS TRIGLYCERIDES AND CHOLESTEROL   Obesity    OSA on CPAP 04/05/2012   RSD lower limb    Testosterone deficiency    Past Surgical History:  Procedure Laterality Date   CHOLECYSTECTOMY     COLONOSCOPY     FINGER SURGERY     HERNIA REPAIR     x2   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   PARTIAL KNEE ARTHROPLASTY Left 09/26/2024   Procedure: ARTHROPLASTY, KNEE, UNICOMPARTMENTAL;  Surgeon: Josefina Chew, MD;  Location: [facility];  Service: Orthopedics;  Laterality: Left;   WISDOM TOOTH EXTRACTION     Patient Active Problem List   Diagnosis Date Noted   Chronic pain of left knee 11/01/2023   Encounter for general adult medical examination with abnormal findings 08/23/2023   Diabetes mellitus treated with insulin and oral medication (HCC) 08/23/2023   Screening PSA (prostate specific antigen) 08/23/2023   Long-term  current use of injectable noninsulin antidiabetic medication 08/23/2023   Diabetes mellitus, type 2 (HCC) 03/31/2022   Obesity 03/31/2022   Testosterone deficiency 03/31/2022   Ventral incisional hernia 03/26/2022   CAD (coronary artery disease), native coronary artery 02/05/2022   OSA (obstructive sleep apnea) 09/22/2017   Hyperlipidemia 12/01/2012   GERD (gastroesophageal reflux disease) 04/05/2012   Hiatal hernia 04/05/2012   Obstructive sleep apnea (adult) (pediatric) 04/05/2012   REFERRING PROVIDER: Chew Josefina MD  REFERRING DIAG: S/p left unilateral knee arthroplasty.    THERAPY DIAG:  Chronic pain of left knee  Localized edema  Muscle weakness (generalized)  Stiffness of left knee, not elsewhere classified  Rationale for Evaluation and Treatment: Rehabilitation  ONSET DATE: 09/26/24 (surgery date).    SUBJECTIVE:   SUBJECTIVE STATEMENT: Not too bad.  Had a fall over the weekend bringing walker down stairs.  To doctor yesterday.  X-ray looked good.    PERTINENT HISTORY: See above.  Right LE surgery (1998). PAIN:  Are you having pain? Yes: NPRS scale: 8/10.   Pain location: Left knee.   Pain description: Sharp.   Aggravating factors: As above.   Relieving factors: As above.    PRECAUTIONS: Other: No U/S.    RED FLAGS: None   WEIGHT BEARING RESTRICTIONS: No  FALLS:  Has patient  fallen in last 6 months? No  LIVING ENVIRONMENT: Lives with: lives with their spouse Lives in: House/apartment Has following equipment at home: Vannie - 4 wheeled  PLOF: Independent  PATIENT GOALS: Perform ADL's without knee pain.    OBJECTIVE:   PATIENT SURVEYS:  LEFS:  10/50.    EDEMA:  Circumferential: Left 4 cms > right.  PALPATION: Diffuse left knee pain.  Post-surgical dressing intact.    LOWER EXTREMITY ROM:  In supine:  Left knee extension to -5 degrees and initial flexion to 40 degrees.  He achieved 55 degrees after 10 minutes on Nustep.    LOWER  EXTREMITY MMT:  The patient is currently unable to perform an antigravity left SLR and SAQ.    GAIT: The patient is walking safely with a Rolator with very good heel strike.                                                                                                                                  TREATMENT DATE:     Nustep level 1 x 15 minutes starting at seat 12 and finishing at at seat 11 f/b seated knee glide x 5 minutes f/b assisted LAQ's x 3 minutes f/b PROM into flexion and extension x 3 minutes f/b assisted SAQ's x 3 minutes  f/b LE elevation and vasopneumatic on low to left knee.      PATIENT EDUCATION:  Education details: Patient instructed to be compliant to his HEP. Person educated: Patient Education method: Explanation Education comprehension: verbalized understanding  HOME EXERCISE PROGRAM:   ASSESSMENT:  CLINICAL IMPRESSION: Patient did well today.  He achieved full right knee extension in supine and and 70 degrees of flexion. Encouraged him to be compliant to his HEP.  OBJECTIVE IMPAIRMENTS: Abnormal gait, decreased activity tolerance, decreased mobility, decreased ROM, decreased strength, increased edema, and pain.   ACTIVITY LIMITATIONS: carrying, lifting, bending, standing, stairs, bed mobility, dressing, and locomotion level  PARTICIPATION LIMITATIONS: meal prep, cleaning, laundry, driving, shopping, community activity, occupation, and yard work  PERSONAL FACTORS: Time since onset of injury/illness/exacerbation are also affecting patient's functional outcome.   REHAB POTENTIAL: Good  CLINICAL DECISION MAKING: Stable/uncomplicated  EVALUATION COMPLEXITY: Low   GOALS:  SHORT TERM GOALS: Target date: 10/13/24  Ind with an initial HEP. Goal status: INITIAL  2.  Full active left knee extension. Goal status: INITIAL  3.  Active left knee flexion to 90 degrees.  Goal status: INITIAL   LONG TERM GOALS: Target date: 11/10/24  Ind with an  advanced HEP.  Goal status: INITIAL  2.  Active left knee flexion to 115 degrees+ so the patient can perform functional tasks and do so with pain not > 2-3/10.  Goal status: INITIAL  3.  Increase left hip and knee strength to a solid 4+/5 to provide good stability for accomplishment of functional activities. Goal status: INITIAL  4.  Perform a reciprocating stair gait with one railing  with pain not > 2-3/10.  Goal status: INITIAL  5.  Improve LEFS score by at least 30 points.  Goal status: INITIAL  PLAN:  PT FREQUENCY: 2x/week  PT DURATION: 6 weeks  PLANNED INTERVENTIONS: 97110-Therapeutic exercises, 97530- Therapeutic activity, [id number]- Neuromuscular re-education, 97535- Self Care, 02859- Manual therapy, G0283- Electrical stimulation (unattended), 97016- Vasopneumatic device, Patient/Family education, and Cryotherapy  PLAN FOR NEXT SESSION: Nustep, PROM, VMS to quads PRN.  Vasopneumatic.     Saray Capasso, PT 10/03/2024, 5:55 PM

## 2024-10-05 ENCOUNTER — Ambulatory Visit: Admitting: *Deleted

## 2024-10-05 DIAGNOSIS — M25662 Stiffness of left knee, not elsewhere classified: Secondary | ICD-10-CM

## 2024-10-05 DIAGNOSIS — R600 Localized edema: Secondary | ICD-10-CM

## 2024-10-05 DIAGNOSIS — G8929 Other chronic pain: Secondary | ICD-10-CM

## 2024-10-05 DIAGNOSIS — M25562 Pain in left knee: Secondary | ICD-10-CM | POA: Diagnosis not present

## 2024-10-05 DIAGNOSIS — M6281 Muscle weakness (generalized): Secondary | ICD-10-CM

## 2024-10-05 NOTE — Therapy (Signed)
 OUTPATIENT PHYSICAL THERAPY LOWER EXTREMITY EVALUATION   Patient Name: Jorge [name] MRN: [id number] DOB:05-Jul-1971, 53 y.o., male Today's Date: 10/05/2024  END OF SESSION:  PT End of Session - 10/05/24 1652     Visit Number 3    Number of Visits 12    Date for Recertification  11/10/24    PT Start Time 1642    PT Stop Time 1742    PT Time Calculation (min) 60 min          Past Medical History:  Diagnosis Date   Arthritis    CAD (coronary artery disease), native coronary artery    Coronary CTA showed mild nonobstructive CAD 25 to 49% stenosis in the mid RCA.   Complication of anesthesia    woke up early during dental surgery- tried to run away   Diabetes mellitus type 2, controlled (HCC)    Family history of adverse reaction to anesthesia    strong family history of MVP   GERD (gastroesophageal reflux disease) 04/05/2012   Hiatal hernia 04/05/2012   Hyperlipidemia 12/01/2012   HE PREFERS TO USE NIACIN AND BRAN TO LOWER HIS TRIGLYCERIDES AND CHOLESTEROL   Obesity    OSA on CPAP 04/05/2012   RSD lower limb    Testosterone deficiency    Past Surgical History:  Procedure Laterality Date   CHOLECYSTECTOMY     COLONOSCOPY     FINGER SURGERY     HERNIA REPAIR     x2   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   PARTIAL KNEE ARTHROPLASTY Left 09/26/2024   Procedure: ARTHROPLASTY, KNEE, UNICOMPARTMENTAL;  Surgeon: Josefina Chew, MD;  Location: [facility];  Service: Orthopedics;  Laterality: Left;   WISDOM TOOTH EXTRACTION     Patient Active Problem List   Diagnosis Date Noted   Chronic pain of left knee 11/01/2023   Encounter for general adult medical examination with abnormal findings 08/23/2023   Diabetes mellitus treated with insulin and oral medication (HCC) 08/23/2023   Screening PSA (prostate specific antigen) 08/23/2023   Long-term current use of injectable noninsulin antidiabetic medication 08/23/2023   Diabetes mellitus, type 2 (HCC)  03/31/2022   Obesity 03/31/2022   Testosterone deficiency 03/31/2022   Ventral incisional hernia 03/26/2022   CAD (coronary artery disease), native coronary artery 02/05/2022   OSA (obstructive sleep apnea) 09/22/2017   Hyperlipidemia 12/01/2012   GERD (gastroesophageal reflux disease) 04/05/2012   Hiatal hernia 04/05/2012   Obstructive sleep apnea (adult) (pediatric) 04/05/2012   REFERRING PROVIDER: Chew Josefina MD  REFERRING DIAG: S/p left unilateral knee arthroplasty.    THERAPY DIAG:  Chronic pain of left knee  Localized edema  Muscle weakness (generalized)  Stiffness of left knee, not elsewhere classified  Rationale for Evaluation and Treatment: Rehabilitation  ONSET DATE: 09/26/24 (surgery date).    SUBJECTIVE:   SUBJECTIVE STATEMENT: Not too bad.  Had a fall over the weekend bringing walker down stairs.  To doctor yesterday.  X-ray looked good.    PERTINENT HISTORY: See above.  Right LE surgery (1998). PAIN:  Are you having pain? Yes: NPRS scale: 8/10.   Pain location: Left knee.   Pain description: Sharp.   Aggravating factors: As above.   Relieving factors: As above.    PRECAUTIONS: Other: No U/S.    RED FLAGS: None   WEIGHT BEARING RESTRICTIONS: No  FALLS:  Has patient fallen in last 6 months? No  LIVING ENVIRONMENT: Lives with: lives with their spouse Lives in: House/apartment Has  following equipment at home: Vannie - 4 wheeled  PLOF: Independent  PATIENT GOALS: Perform ADL's without knee pain.    OBJECTIVE:   PATIENT SURVEYS:  LEFS:  10/50.    EDEMA:  Circumferential: Left 4 cms > right.  PALPATION: Diffuse left knee pain.  Post-surgical dressing intact.    LOWER EXTREMITY ROM:  In supine:  Left knee extension to -5 degrees and initial flexion to 40 degrees.  He achieved 55 degrees after 10 minutes on Nustep.    LOWER EXTREMITY MMT:  The patient is currently unable to perform an antigravity left SLR and SAQ.    GAIT: The  patient is walking safely with a Rolator with very good heel strike.                                                                                                                                  TREATMENT DATE:     Nustep level 1 x 15 minutes starting at seat 12,11    supine knee glide x 5 minutes   Manual STW to quads and patella mobs  PROM into flexion and extension   x 12  minutes   80 degrees today   SAQ's x 3 minutes    LE elevation and vasopneumatic on low to left knee.      PATIENT EDUCATION:  Education details: Patient instructed to be compliant to his HEP. Person educated: Patient Education method: Explanation Education comprehension: verbalized understanding  HOME EXERCISE PROGRAM:   ASSESSMENT:  CLINICAL IMPRESSION: Patient did well today.  He achieved full right knee extension in supine and and 80 degrees of knee flexion. Discussed HEP.Vaso end of session  OBJECTIVE IMPAIRMENTS: Abnormal gait, decreased activity tolerance, decreased mobility, decreased ROM, decreased strength, increased edema, and pain.   ACTIVITY LIMITATIONS: carrying, lifting, bending, standing, stairs, bed mobility, dressing, and locomotion level  PARTICIPATION LIMITATIONS: meal prep, cleaning, laundry, driving, shopping, community activity, occupation, and yard work  PERSONAL FACTORS: Time since onset of injury/illness/exacerbation are also affecting patient's functional outcome.   REHAB POTENTIAL: Good  CLINICAL DECISION MAKING: Stable/uncomplicated  EVALUATION COMPLEXITY: Low   GOALS:  SHORT TERM GOALS: Target date: 10/13/24  Ind with an initial HEP. Goal status: INITIAL  2.  Full active left knee extension. Goal status: INITIAL  3.  Active left knee flexion to 90 degrees.  Goal status: INITIAL   LONG TERM GOALS: Target date: 11/10/24  Ind with an advanced HEP.  Goal status: INITIAL  2.  Active left knee flexion to 115 degrees+ so the patient can perform functional  tasks and do so with pain not > 2-3/10.  Goal status: INITIAL  3.  Increase left hip and knee strength to a solid 4+/5 to provide good stability for accomplishment of functional activities. Goal status: INITIAL  4.  Perform a reciprocating stair gait with one railing with pain not > 2-3/10.  Goal status: INITIAL  5.  Improve  LEFS score by at least 30 points.  Goal status: INITIAL  PLAN:  PT FREQUENCY: 2x/week  PT DURATION: 6 weeks  PLANNED INTERVENTIONS: 97110-Therapeutic exercises, 97530- Therapeutic activity, [id number]- Neuromuscular re-education, 97535- Self Care, 02859- Manual therapy, G0283- Electrical stimulation (unattended), 97016- Vasopneumatic device, Patient/Family education, and Cryotherapy  PLAN FOR NEXT SESSION: Nustep, PROM, VMS to quads PRN.  Vasopneumatic.     Adelisa Satterwhite,CHRIS, PTA 10/05/2024, 5:42 PM

## 2024-10-06 ENCOUNTER — Telehealth: Payer: Self-pay | Admitting: Cardiology

## 2024-10-06 NOTE — Telephone Encounter (Signed)
 Patients supplies has been sent to [facility].  Office notes dated sent for 10/04/2023.

## 2024-10-06 NOTE — Telephone Encounter (Signed)
 Caller Elijio) called to follow-up on getting patient CPAP equipment ordered.

## 2024-10-09 ENCOUNTER — Ambulatory Visit: Admitting: Physical Therapy

## 2024-10-09 ENCOUNTER — Encounter: Payer: Self-pay | Admitting: Physical Therapy

## 2024-10-09 DIAGNOSIS — M25662 Stiffness of left knee, not elsewhere classified: Secondary | ICD-10-CM

## 2024-10-09 DIAGNOSIS — M25562 Pain in left knee: Secondary | ICD-10-CM | POA: Diagnosis not present

## 2024-10-09 DIAGNOSIS — G8929 Other chronic pain: Secondary | ICD-10-CM

## 2024-10-09 DIAGNOSIS — R600 Localized edema: Secondary | ICD-10-CM

## 2024-10-09 DIAGNOSIS — M6281 Muscle weakness (generalized): Secondary | ICD-10-CM

## 2024-10-09 NOTE — Therapy (Signed)
 OUTPATIENT PHYSICAL THERAPY LOWER EXTREMITY TREATMENT  Patient Name: Jorge [name] MRN: [id number] DOB:16-May-1971, 53 y.o., male Today's Date: 10/09/2024  END OF SESSION:  PT End of Session - 10/09/24 1522     Visit Number 4    Number of Visits 12    Date for Recertification  11/10/24    PT Start Time 0315          Past Medical History:  Diagnosis Date   Arthritis    CAD (coronary artery disease), native coronary artery    Coronary CTA showed mild nonobstructive CAD 25 to 49% stenosis in the mid RCA.   Complication of anesthesia    woke up early during dental surgery- tried to run away   Diabetes mellitus type 2, controlled (HCC)    Family history of adverse reaction to anesthesia    strong family history of MVP   GERD (gastroesophageal reflux disease) 04/05/2012   Hiatal hernia 04/05/2012   Hyperlipidemia 12/01/2012   HE PREFERS TO USE NIACIN AND BRAN TO LOWER HIS TRIGLYCERIDES AND CHOLESTEROL   Obesity    OSA on CPAP 04/05/2012   RSD lower limb    Testosterone deficiency    Past Surgical History:  Procedure Laterality Date   CHOLECYSTECTOMY     COLONOSCOPY     FINGER SURGERY     HERNIA REPAIR     x2   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   PARTIAL KNEE ARTHROPLASTY Left 09/26/2024   Procedure: ARTHROPLASTY, KNEE, UNICOMPARTMENTAL;  Surgeon: Josefina Chew, MD;  Location: [facility];  Service: Orthopedics;  Laterality: Left;   WISDOM TOOTH EXTRACTION     Patient Active Problem List   Diagnosis Date Noted   Chronic pain of left knee 11/01/2023   Encounter for general adult medical examination with abnormal findings 08/23/2023   Diabetes mellitus treated with insulin  and oral medication (HCC) 08/23/2023   Screening PSA (prostate specific antigen) 08/23/2023   Long-term current use of injectable noninsulin antidiabetic medication 08/23/2023   Diabetes mellitus, type 2 (HCC) 03/31/2022   Obesity 03/31/2022   Testosterone deficiency  03/31/2022   Ventral incisional hernia 03/26/2022   CAD (coronary artery disease), native coronary artery 02/05/2022   OSA (obstructive sleep apnea) 09/22/2017   Hyperlipidemia 12/01/2012   GERD (gastroesophageal reflux disease) 04/05/2012   Hiatal hernia 04/05/2012   Obstructive sleep apnea (adult) (pediatric) 04/05/2012   REFERRING PROVIDER: Chew Josefina MD  REFERRING DIAG: S/p left unilateral knee arthroplasty.    THERAPY DIAG:  Chronic pain of left knee  Localized edema  Muscle weakness (generalized)  Stiffness of left knee, not elsewhere classified  Rationale for Evaluation and Treatment: Rehabilitation  ONSET DATE: 09/26/24 (surgery date).    SUBJECTIVE:   SUBJECTIVE STATEMENT: Not too bad.  Had a fall over the weekend bringing walker down stairs.  To doctor yesterday.  X-ray looked good.    PERTINENT HISTORY: See above.  Right LE surgery (1998). PAIN:  Are you having pain? Yes: NPRS scale: 8/10.   Pain location: Left knee.   Pain description: Sharp.   Aggravating factors: As above.   Relieving factors: As above.    PRECAUTIONS: Other: No U/S.    RED FLAGS: None   WEIGHT BEARING RESTRICTIONS: No  FALLS:  Has patient fallen in last 6 months? No  LIVING ENVIRONMENT: Lives with: lives with their spouse Lives in: House/apartment Has following equipment at home: Vannie - 4 wheeled  PLOF: Independent  PATIENT GOALS: Perform ADL's without  knee pain.    OBJECTIVE:   PATIENT SURVEYS:  LEFS:  10/50.    EDEMA:  Circumferential: Left 4 cms > right.  PALPATION: Diffuse left knee pain.  Post-surgical dressing intact.    LOWER EXTREMITY ROM:  In supine:  Left knee extension to -5 degrees and initial flexion to 40 degrees.  He achieved 55 degrees after 10 minutes on Nustep.    LOWER EXTREMITY MMT:  The patient is currently unable to perform an antigravity left SLR and SAQ.    GAIT: The patient is walking safely with a Rolator with very good heel  strike.                                                                                                                                  TREATMENT DATE:   10/09/24:                                     EXERCISE LOG  Exercise Repetitions and Resistance Comments  Nustep  Level 1 x 15 minutes progressing to seat 10   Box lunges 14 inches in parallel bars x 4 minutes    Seated chair scoots with foot against wall 3 minutes   Knee glide 4 minutes    Self flexion stretch using right LE 2 minutes   SAQ's 3 minutes   In supine:  PROM x 7 minutes into left knee flexion and extension f/b LE elevation and vasopneumatic on low to patient's left knee.      Nustep level 1 x 15 minutes starting at seat 12,11    supine knee glide x 5 minutes   Manual STW to quads and patella mobs  PROM into flexion and extension   x 12  minutes   80 degrees today   SAQ's x 3 minutes    LE elevation and vasopneumatic on low to left knee.      PATIENT EDUCATION:  Education details: Patient instructed to be compliant to his HEP. Person educated: Patient Education method: Explanation Education comprehension: verbalized understanding  HOME EXERCISE PROGRAM:   ASSESSMENT:  CLINICAL IMPRESSION: Added multiple new interventions today including box lunges, chair scoots and self flexion stretch using her right LE.  He is very motivated.  He achieved passive flexion to 80 degrees again today.    OBJECTIVE IMPAIRMENTS: Abnormal gait, decreased activity tolerance, decreased mobility, decreased ROM, decreased strength, increased edema, and pain.   ACTIVITY LIMITATIONS: carrying, lifting, bending, standing, stairs, bed mobility, dressing, and locomotion level  PARTICIPATION LIMITATIONS: meal prep, cleaning, laundry, driving, shopping, community activity, occupation, and yard work  PERSONAL FACTORS: Time since onset of injury/illness/exacerbation are also affecting patient's functional outcome.   REHAB POTENTIAL:  Good  CLINICAL DECISION MAKING: Stable/uncomplicated  EVALUATION COMPLEXITY: Low   GOALS:  SHORT TERM GOALS: Target date: 10/13/24  Ind with an initial  HEP. Goal status: INITIAL  2.  Full active left knee extension. Goal status: INITIAL  3.  Active left knee flexion to 90 degrees.  Goal status: INITIAL   LONG TERM GOALS: Target date: 11/10/24  Ind with an advanced HEP.  Goal status: INITIAL  2.  Active left knee flexion to 115 degrees+ so the patient can perform functional tasks and do so with pain not > 2-3/10.  Goal status: INITIAL  3.  Increase left hip and knee strength to a solid 4+/5 to provide good stability for accomplishment of functional activities. Goal status: INITIAL  4.  Perform a reciprocating stair gait with one railing with pain not > 2-3/10.  Goal status: INITIAL  5.  Improve LEFS score by at least 30 points.  Goal status: INITIAL  PLAN:  PT FREQUENCY: 2x/week  PT DURATION: 6 weeks  PLANNED INTERVENTIONS: 97110-Therapeutic exercises, 97530- Therapeutic activity, [id number]- Neuromuscular re-education, 97535- Self Care, 02859- Manual therapy, G0283- Electrical stimulation (unattended), 97016- Vasopneumatic device, Patient/Family education, and Cryotherapy  PLAN FOR NEXT SESSION: Nustep, PROM, VMS to quads PRN.  Vasopneumatic.     Shamell Suarez, PT 10/09/2024, 4:42 PM

## 2024-10-11 ENCOUNTER — Ambulatory Visit: Admitting: Physical Therapy

## 2024-10-11 DIAGNOSIS — M25662 Stiffness of left knee, not elsewhere classified: Secondary | ICD-10-CM

## 2024-10-11 DIAGNOSIS — R600 Localized edema: Secondary | ICD-10-CM

## 2024-10-11 DIAGNOSIS — G8929 Other chronic pain: Secondary | ICD-10-CM

## 2024-10-11 DIAGNOSIS — M6281 Muscle weakness (generalized): Secondary | ICD-10-CM

## 2024-10-11 DIAGNOSIS — M25562 Pain in left knee: Secondary | ICD-10-CM | POA: Diagnosis not present

## 2024-10-11 NOTE — Therapy (Signed)
 OUTPATIENT PHYSICAL THERAPY LOWER EXTREMITY TREATMENT  Patient Name: Jorge [name] MRN: [id number] DOB:[birth year], 53 y.o., male Today's Date: 10/11/2024  END OF SESSION:  PT End of Session - 10/11/24 0937     Visit Number 5    Number of Visits 12    Date for Recertification  11/10/24    PT Start Time 0930    PT Stop Time 1029    PT Time Calculation (min) 59 min    Activity Tolerance Patient tolerated treatment well    Behavior During Therapy [facility] for tasks assessed/performed           Past Medical History:  Diagnosis Date   Arthritis    CAD (coronary artery disease), native coronary artery    Coronary CTA showed mild nonobstructive CAD 25 to 49% stenosis in the mid RCA.   Complication of anesthesia    woke up early during dental surgery- tried to run away   Diabetes mellitus type 2, controlled (HCC)    Family history of adverse reaction to anesthesia    strong family history of MVP   GERD (gastroesophageal reflux disease) 04/05/2012   Hiatal hernia 04/05/2012   Hyperlipidemia 12/01/2012   HE PREFERS TO USE NIACIN AND BRAN TO LOWER HIS TRIGLYCERIDES AND CHOLESTEROL   Obesity    OSA on CPAP 04/05/2012   RSD lower limb    Testosterone deficiency    Past Surgical History:  Procedure Laterality Date   CHOLECYSTECTOMY     COLONOSCOPY     FINGER SURGERY     HERNIA REPAIR     x2   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   PARTIAL KNEE ARTHROPLASTY Left 09/26/2024   Procedure: ARTHROPLASTY, KNEE, UNICOMPARTMENTAL;  Surgeon: Josefina Chew, MD;  Location: [facility];  Service: Orthopedics;  Laterality: Left;   WISDOM TOOTH EXTRACTION     Patient Active Problem List   Diagnosis Date Noted   Chronic pain of left knee 11/01/2023   Encounter for general adult medical examination with abnormal findings 08/23/2023   Diabetes mellitus treated with insulin  and oral medication (HCC) 08/23/2023   Screening PSA (prostate specific antigen) 08/23/2023    Long-term current use of injectable noninsulin antidiabetic medication 08/23/2023   Diabetes mellitus, type 2 (HCC) 03/31/2022   Obesity 03/31/2022   Testosterone deficiency 03/31/2022   Ventral incisional hernia 03/26/2022   CAD (coronary artery disease), native coronary artery 02/05/2022   OSA (obstructive sleep apnea) 09/22/2017   Hyperlipidemia 12/01/2012   GERD (gastroesophageal reflux disease) 04/05/2012   Hiatal hernia 04/05/2012   Obstructive sleep apnea (adult) (pediatric) 04/05/2012   REFERRING PROVIDER: Chew Josefina MD  REFERRING DIAG: S/p left unilateral knee arthroplasty.    THERAPY DIAG:  Chronic pain of left knee  Localized edema  Muscle weakness (generalized)  Stiffness of left knee, not elsewhere classified  Rationale for Evaluation and Treatment: Rehabilitation  ONSET DATE: 09/26/24 (surgery date).    SUBJECTIVE:   SUBJECTIVE STATEMENT: Sore.  Did the new exercises.    PERTINENT HISTORY: See above.  Right LE surgery (1998). PAIN:  Are you having pain? Yes: NPRS scale: 8/10.   Pain location: Left knee.   Pain description: Sharp.   Aggravating factors: As above.   Relieving factors: As above.    PRECAUTIONS: Other: No U/S.    RED FLAGS: None   WEIGHT BEARING RESTRICTIONS: No  FALLS:  Has patient fallen in last 6 months? No  LIVING ENVIRONMENT: Lives with: lives with their spouse Lives  in: House/apartment Has following equipment at home: Walker - 4 wheeled  PLOF: Independent  PATIENT GOALS: Perform ADL's without knee pain.    OBJECTIVE:   PATIENT SURVEYS:  LEFS:  10/50.    EDEMA:  Circumferential: Left 4 cms > right.  PALPATION: Diffuse left knee pain.  Post-surgical dressing intact.    LOWER EXTREMITY ROM:  In supine:  Left knee extension to -5 degrees and initial flexion to 40 degrees.  He achieved 55 degrees after 10 minutes on Nustep.    LOWER EXTREMITY MMT:  The patient is currently unable to perform an antigravity  left SLR and SAQ.    GAIT: The patient is walking safely with a Rolator with very good heel strike.                                                                                                                                  TREATMENT DATE:   10/11/24:                                     EXERCISE LOG  Exercise Repetitions and Resistance Comments  Nustep 15 minutes    Box lunges 14 x 5 minutes    Standing ham curls in parallel bars 3 minutes   SAQ's 1# x 6 minutes    Heel slides with sheet 3 minutes.       LE elevation and vasopneumatic on low to patient's left knee x 20 minutes.    10/09/24:                                     EXERCISE LOG  Exercise Repetitions and Resistance Comments  Nustep  Level 1 x 15 minutes progressing to seat 10   Box lunges 14 inches in parallel bars x 4 minutes    Seated chair scoots with foot against wall 3 minutes   Knee glide 4 minutes    Self flexion stretch using right LE 2 minutes   SAQ's 3 minutes   In supine:  PROM x 7 minutes into left knee flexion and extension f/b LE elevation and vasopneumatic on low to patient's left knee.      Nustep level 1 x 15 minutes starting at seat 12,11    supine knee glide x 5 minutes   Manual STW to quads and patella mobs  PROM into flexion and extension   x 12  minutes   80 degrees today   SAQ's x 3 minutes    LE elevation and vasopneumatic on low to left knee.      PATIENT EDUCATION:  Education details: Patient instructed to be compliant to his HEP. Person educated: Patient Education method: Explanation Education comprehension: verbalized understanding  HOME EXERCISE PROGRAM:   ASSESSMENT:  CLINICAL IMPRESSION:  Patient is very motivated and achieved 88 degrees of passive left knee flexion on the box lunge exercise.    OBJECTIVE IMPAIRMENTS: Abnormal gait, decreased activity tolerance, decreased mobility, decreased ROM, decreased strength, increased edema, and pain.   ACTIVITY  LIMITATIONS: carrying, lifting, bending, standing, stairs, bed mobility, dressing, and locomotion level  PARTICIPATION LIMITATIONS: meal prep, cleaning, laundry, driving, shopping, community activity, occupation, and yard work  PERSONAL FACTORS: Time since onset of injury/illness/exacerbation are also affecting patient's functional outcome.   REHAB POTENTIAL: Good  CLINICAL DECISION MAKING: Stable/uncomplicated  EVALUATION COMPLEXITY: Low   GOALS:  SHORT TERM GOALS: Target date: 10/13/24  Ind with an initial HEP. Goal status: INITIAL  2.  Full active left knee extension. Goal status: INITIAL  3.  Active left knee flexion to 90 degrees.  Goal status: INITIAL   LONG TERM GOALS: Target date: 11/10/24  Ind with an advanced HEP.  Goal status: INITIAL  2.  Active left knee flexion to 115 degrees+ so the patient can perform functional tasks and do so with pain not > 2-3/10.  Goal status: INITIAL  3.  Increase left hip and knee strength to a solid 4+/5 to provide good stability for accomplishment of functional activities. Goal status: INITIAL  4.  Perform a reciprocating stair gait with one railing with pain not > 2-3/10.  Goal status: INITIAL  5.  Improve LEFS score by at least 30 points.  Goal status: INITIAL  PLAN:  PT FREQUENCY: 2x/week  PT DURATION: 6 weeks  PLANNED INTERVENTIONS: 97110-Therapeutic exercises, 97530- Therapeutic activity, [id number]- Neuromuscular re-education, 97535- Self Care, 02859- Manual therapy, G0283- Electrical stimulation (unattended), 97016- Vasopneumatic device, Patient/Family education, and Cryotherapy  PLAN FOR NEXT SESSION: Nustep, PROM, VMS to quads PRN.  Vasopneumatic.     Antwion Carpenter, PT 10/11/2024, 10:53 AM

## 2024-10-17 ENCOUNTER — Ambulatory Visit: Admitting: Physical Therapy

## 2024-10-17 DIAGNOSIS — G8929 Other chronic pain: Secondary | ICD-10-CM | POA: Diagnosis present

## 2024-10-17 DIAGNOSIS — M25562 Pain in left knee: Secondary | ICD-10-CM | POA: Insufficient documentation

## 2024-10-17 DIAGNOSIS — M6281 Muscle weakness (generalized): Secondary | ICD-10-CM | POA: Diagnosis present

## 2024-10-17 DIAGNOSIS — M25662 Stiffness of left knee, not elsewhere classified: Secondary | ICD-10-CM | POA: Diagnosis present

## 2024-10-17 DIAGNOSIS — R600 Localized edema: Secondary | ICD-10-CM | POA: Insufficient documentation

## 2024-10-17 NOTE — Therapy (Signed)
 OUTPATIENT PHYSICAL THERAPY LOWER EXTREMITY TREATMENT  Patient Name: Jorge [name] MRN: [id number] DOB:[birth year], 53 y.o., male Today's Date: 10/17/2024  END OF SESSION:  PT End of Session - 10/17/24 1520     Visit Number 6    Number of Visits 12    Date for Recertification  11/10/24    PT Start Time 1515    PT Stop Time 1555    PT Time Calculation (min) 40 min    Activity Tolerance Patient tolerated treatment well    Behavior During Therapy [name] for tasks assessed/performed            Past Medical History:  Diagnosis Date   Arthritis    CAD (coronary artery disease), native coronary artery    Coronary CTA showed mild nonobstructive CAD 25 to 49% stenosis in the mid RCA.   Complication of anesthesia    woke up early during dental surgery- tried to run away   Diabetes mellitus type 2, controlled (HCC)    Family history of adverse reaction to anesthesia    strong family history of MVP   GERD (gastroesophageal reflux disease) 04/05/2012   Hiatal hernia 04/05/2012   Hyperlipidemia 12/01/2012   HE PREFERS TO USE NIACIN AND BRAN TO LOWER HIS TRIGLYCERIDES AND CHOLESTEROL   Obesity    OSA on CPAP 04/05/2012   RSD lower limb    Testosterone deficiency    Past Surgical History:  Procedure Laterality Date   CHOLECYSTECTOMY     COLONOSCOPY     FINGER SURGERY     HERNIA REPAIR     x2   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   PARTIAL KNEE ARTHROPLASTY Left 09/26/2024   Procedure: ARTHROPLASTY, KNEE, UNICOMPARTMENTAL;  Surgeon: Josefina Chew, MD;  Location: [facility];  Service: Orthopedics;  Laterality: Left;   WISDOM TOOTH EXTRACTION     Patient Active Problem List   Diagnosis Date Noted   Chronic pain of left knee 11/01/2023   Encounter for general adult medical examination with abnormal findings 08/23/2023   Diabetes mellitus treated with insulin  and oral medication (HCC) 08/23/2023   Screening PSA (prostate specific antigen) 08/23/2023    Long-term current use of injectable noninsulin antidiabetic medication 08/23/2023   Diabetes mellitus, type 2 (HCC) 03/31/2022   Obesity 03/31/2022   Testosterone deficiency 03/31/2022   Ventral incisional hernia 03/26/2022   CAD (coronary artery disease), native coronary artery 02/05/2022   OSA (obstructive sleep apnea) 09/22/2017   Hyperlipidemia 12/01/2012   GERD (gastroesophageal reflux disease) 04/05/2012   Hiatal hernia 04/05/2012   Obstructive sleep apnea (adult) (pediatric) 04/05/2012   REFERRING PROVIDER: Chew Josefina MD  REFERRING DIAG: S/p left unilateral knee arthroplasty.    THERAPY DIAG:  Chronic pain of left knee  Localized edema  Muscle weakness (generalized)  Stiffness of left knee, not elsewhere classified  Rationale for Evaluation and Treatment: Rehabilitation  ONSET DATE: 09/26/24 (surgery date).    SUBJECTIVE:   SUBJECTIVE STATEMENT: Has been doing the bike at home and stair lunges/heel slides. Reports one instance where his knee buckled backwards. Scared him as he has been trying to walk without the cane now.   PERTINENT HISTORY: See above.  Right LE surgery (1998). PAIN:  Are you having pain? Yes: NPRS scale: 8/10.   Pain location: Left knee.   Pain description: Sharp.   Aggravating factors: As above.   Relieving factors: As above.    PRECAUTIONS: Other: No U/S.    RED FLAGS: None  WEIGHT BEARING RESTRICTIONS: No  FALLS:  Has patient fallen in last 6 months? No  LIVING ENVIRONMENT: Lives with: lives with their spouse Lives in: House/apartment Has following equipment at home: Vannie - 4 wheeled  PLOF: Independent  PATIENT GOALS: Perform ADL's without knee pain.    OBJECTIVE:   PATIENT SURVEYS:  LEFS:  10/50.    EDEMA:  Circumferential: Left 4 cms > right.  PALPATION: Diffuse left knee pain.  Post-surgical dressing intact.    LOWER EXTREMITY ROM: (Eval) In supine:  Left knee extension to -5 degrees and initial flexion to  40 degrees.  He achieved 55 degrees after 10 minutes on Nustep.   10/17/24: knee flexion 100 deg on recumbent bike  LOWER EXTREMITY MMT:  The patient is currently unable to perform an antigravity left SLR and SAQ.    GAIT: The patient is walking safely with a Rolator with very good heel strike.                                                                                                                                  TREATMENT DATE:  10/17/24:                                   EXERCISE LOG  Exercise Repetitions and Resistance Comments  Recumbent bike 15 min, intermittently fwd and bwd   Box lunges 14 x 3 minutes    Fwd step tap 6 step 2x10   Side step up/down with L LE x10 With UE assist  Side step tap 6 step 2x10   Leg press Level 2; 2x10 with knees flexed at 80 deg   LE elevation and vasopneumatic on low to patient's left knee x 15 minutes.    10/11/24:                                   EXERCISE LOG  Exercise Repetitions and Resistance Comments  Nustep 15 minutes    Box lunges 14 x 5 minutes    Standing ham curls in parallel bars 3 minutes   SAQ's 1# x 6 minutes    Heel slides with sheet 3 minutes.       LE elevation and vasopneumatic on low to patient's left knee x 20 minutes.    10/09/24:                                     EXERCISE LOG  Exercise Repetitions and Resistance Comments  Nustep  Level 1 x 15 minutes progressing to seat 10   Box lunges 14 inches in parallel bars x 4 minutes    Seated chair scoots with foot against wall 3 minutes   Knee glide 4  minutes    Self flexion stretch using right LE 2 minutes   SAQ's 3 minutes   In supine:  PROM x 7 minutes into left knee flexion and extension f/b LE elevation and vasopneumatic on low to patient's left knee.      Nustep level 1 x 15 minutes starting at seat 12,11    supine knee glide x 5 minutes   Manual STW to quads and patella mobs  PROM into flexion and extension   x 12  minutes   80 degrees  today   SAQ's x 3 minutes    LE elevation and vasopneumatic on low to left knee.      PATIENT EDUCATION:  Education details: Patient instructed to be compliant to his HEP. Person educated: Patient Education method: Explanation Education comprehension: verbalized understanding  HOME EXERCISE PROGRAM:   ASSESSMENT:  CLINICAL IMPRESSION: Lindsey has met all of his STGs. Continuing to work on improving quad and LE strength. Has noted one instance of knee buckling/hyperextension -- likely some decreased eccentric quad strength. Pt is not yet capable of stepping up on step with his L LE first without extensive use of UEs to assist.   OBJECTIVE IMPAIRMENTS: Abnormal gait, decreased activity tolerance, decreased mobility, decreased ROM, decreased strength, increased edema, and pain.   ACTIVITY LIMITATIONS: carrying, lifting, bending, standing, stairs, bed mobility, dressing, and locomotion level  PARTICIPATION LIMITATIONS: meal prep, cleaning, laundry, driving, shopping, community activity, occupation, and yard work  PERSONAL FACTORS: Time since onset of injury/illness/exacerbation are also affecting patient's functional outcome.   REHAB POTENTIAL: Good  CLINICAL DECISION MAKING: Stable/uncomplicated  EVALUATION COMPLEXITY: Low   GOALS:  SHORT TERM GOALS: Target date: 10/13/24  Ind with an initial HEP.  10/17/24: bike, lunges on steps, SLR Goal status: MET  2.  Full active left knee extension.  10/17/24: full ext in standing Goal status: MET  3.  Active left knee flexion to 90 degrees.   10/17/24: 110 deg after lunges on 14 box Goal status: MET   LONG TERM GOALS: Target date: 11/10/24  Ind with an advanced HEP.  Goal status: INITIAL  2.  Active left knee flexion to 115 degrees+ so the patient can perform functional tasks and do so with pain not > 2-3/10.  Goal status: INITIAL  3.  Increase left hip and knee strength to a solid 4+/5 to provide good stability for  accomplishment of functional activities. Goal status: INITIAL  4.  Perform a reciprocating stair gait with one railing with pain not > 2-3/10.  Goal status: INITIAL  5.  Improve LEFS score by at least 30 points.  Goal status: INITIAL  PLAN:  PT FREQUENCY: 2x/week  PT DURATION: 6 weeks  PLANNED INTERVENTIONS: 97110-Therapeutic exercises, 97530- Therapeutic activity, [id number]- Neuromuscular re-education, 97535- Self Care, 02859- Manual therapy, G0283- Electrical stimulation (unattended), 97016- Vasopneumatic device, Patient/Family education, and Cryotherapy  PLAN FOR NEXT SESSION: Nustep, PROM, VMS to quads PRN.  Vasopneumatic.     Emiline Mancebo April Ma L Hartlee Amedee, PT, DPT 10/17/2024, 3:22 PM

## 2024-10-19 ENCOUNTER — Ambulatory Visit

## 2024-10-19 ENCOUNTER — Encounter: Payer: Self-pay | Admitting: *Deleted

## 2024-10-19 ENCOUNTER — Ambulatory Visit: Admitting: *Deleted

## 2024-10-19 DIAGNOSIS — M25662 Stiffness of left knee, not elsewhere classified: Secondary | ICD-10-CM

## 2024-10-19 DIAGNOSIS — R600 Localized edema: Secondary | ICD-10-CM

## 2024-10-19 DIAGNOSIS — M6281 Muscle weakness (generalized): Secondary | ICD-10-CM

## 2024-10-19 DIAGNOSIS — M25562 Pain in left knee: Secondary | ICD-10-CM | POA: Diagnosis not present

## 2024-10-19 DIAGNOSIS — G8929 Other chronic pain: Secondary | ICD-10-CM

## 2024-10-19 NOTE — Therapy (Signed)
 OUTPATIENT PHYSICAL THERAPY LOWER EXTREMITY TREATMENT  Patient Name: Jorge [name] MRN: [id number] DOB:14-Apr-1971, 53 y.o., male Today's Date: 10/19/2024  END OF SESSION:  PT End of Session - 10/19/24 1526     Visit Number 7    Number of Visits 12    Date for Recertification  11/10/24    PT Start Time 1515    PT Stop Time 1615    PT Time Calculation (min) 60 min            Past Medical History:  Diagnosis Date   Arthritis    CAD (coronary artery disease), native coronary artery    Coronary CTA showed mild nonobstructive CAD 25 to 49% stenosis in the mid RCA.   Complication of anesthesia    woke up early during dental surgery- tried to run away   Diabetes mellitus type 2, controlled (HCC)    Family history of adverse reaction to anesthesia    strong family history of MVP   GERD (gastroesophageal reflux disease) 04/05/2012   Hiatal hernia 04/05/2012   Hyperlipidemia 12/01/2012   HE PREFERS TO USE NIACIN AND BRAN TO LOWER HIS TRIGLYCERIDES AND CHOLESTEROL   Obesity    OSA on CPAP 04/05/2012   RSD lower limb    Testosterone deficiency    Past Surgical History:  Procedure Laterality Date   CHOLECYSTECTOMY     COLONOSCOPY     FINGER SURGERY     HERNIA REPAIR     x2   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   PARTIAL KNEE ARTHROPLASTY Left 09/26/2024   Procedure: ARTHROPLASTY, KNEE, UNICOMPARTMENTAL;  Surgeon: Josefina Chew, MD;  Location: [facility];  Service: Orthopedics;  Laterality: Left;   WISDOM TOOTH EXTRACTION     Patient Active Problem List   Diagnosis Date Noted   Chronic pain of left knee 11/01/2023   Encounter for general adult medical examination with abnormal findings 08/23/2023   Diabetes mellitus treated with insulin  and oral medication (HCC) 08/23/2023   Screening PSA (prostate specific antigen) 08/23/2023   Long-term current use of injectable noninsulin antidiabetic medication 08/23/2023   Diabetes mellitus, type 2 (HCC)  03/31/2022   Obesity 03/31/2022   Testosterone deficiency 03/31/2022   Ventral incisional hernia 03/26/2022   CAD (coronary artery disease), native coronary artery 02/05/2022   OSA (obstructive sleep apnea) 09/22/2017   Hyperlipidemia 12/01/2012   GERD (gastroesophageal reflux disease) 04/05/2012   Hiatal hernia 04/05/2012   Obstructive sleep apnea (adult) (pediatric) 04/05/2012   REFERRING PROVIDER: Chew Josefina MD  REFERRING DIAG: S/p left unilateral knee arthroplasty.    THERAPY DIAG:  Chronic pain of left knee  Localized edema  Muscle weakness (generalized)  Stiffness of left knee, not elsewhere classified  Rationale for Evaluation and Treatment: Rehabilitation  ONSET DATE: 09/26/24 (surgery date).    SUBJECTIVE:   SUBJECTIVE STATEMENT: Has been doing the bike at home and stair lunges/heel slides.Knee is tight PERTINENT HISTORY: See above.  Right LE surgery (1998). PAIN:  Are you having pain? Yes: NPRS scale: 8/10.   Pain location: Left knee.   Pain description: Sharp.   Aggravating factors: As above.   Relieving factors: As above.    PRECAUTIONS: Other: No U/S.    RED FLAGS: None   WEIGHT BEARING RESTRICTIONS: No  FALLS:  Has patient fallen in last 6 months? No  LIVING ENVIRONMENT: Lives with: lives with their spouse Lives in: House/apartment Has following equipment at home: Vannie - 4 wheeled  PLOF: Independent  PATIENT GOALS: Perform ADL's without knee pain.    OBJECTIVE:   PATIENT SURVEYS:  LEFS:  10/50.    EDEMA:  Circumferential: Left 4 cms > right.  PALPATION: Diffuse left knee pain.  Post-surgical dressing intact.    LOWER EXTREMITY ROM: (Eval) In supine:  Left knee extension to -5 degrees and initial flexion to 40 degrees.  He achieved 55 degrees after 10 minutes on Nustep.   10/17/24: knee flexion 100 deg on recumbent bike  LOWER EXTREMITY MMT:  The patient is currently unable to perform an antigravity left SLR and SAQ.     GAIT: The patient is walking safely with a Rolator with very good heel strike.                                                                                                                                  TREATMENT DATE:  10/19/24:                                   EXERCISE LOG   LT knee  Exercise Repetitions and Resistance Comments  Recumbent bike 15 min   seat 8,   Box lunges 14 x 3 minutes    Fwd step tap 6 step 2x10   Side step up/down with L LE x10 With UE assist  Side step tap 6 step 2x10   Leg press Level 2; 2x10 with knees flexed at 80 deg   Manual PROM for extension ROM to full extension end of session  LE elevation and vasopneumatic on low to patient's left knee x 15 minutes.    10/11/24:                                   EXERCISE LOG  Exercise Repetitions and Resistance Comments  Nustep 15 minutes    Box lunges 14 x 5 minutes    Standing ham curls in parallel bars 3 minutes   SAQ's 1# x 6 minutes    Heel slides with sheet 3 minutes.       LE elevation and vasopneumatic on low to patient's left knee x 20 minutes.    10/09/24:                                     EXERCISE LOG  Exercise Repetitions and Resistance Comments  Nustep  Level 1 x 15 minutes progressing to seat 10   Box lunges 14 inches in parallel bars x 4 minutes    Seated chair scoots with foot against wall 3 minutes   Knee glide 4 minutes    Self flexion stretch using right LE 2 minutes   SAQ's 3 minutes   In supine:  PROM x  7 minutes into left knee flexion and extension f/b LE elevation and vasopneumatic on low to patient's left knee.      Nustep level 1 x 15 minutes starting at seat 12,11    supine knee glide x 5 minutes   Manual STW to quads and patella mobs  PROM into flexion and extension   x 12  minutes   80 degrees today   SAQ's x 3 minutes    LE elevation and vasopneumatic on low to left knee.      PATIENT EDUCATION:  Education details: Patient instructed to be compliant  to his HEP. Person educated: Patient Education method: Explanation Education comprehension: verbalized understanding  HOME EXERCISE PROGRAM:   ASSESSMENT:  CLINICAL IMPRESSION: Rx focused on  Continuing to work on improving quad and LE strength as well as  ROM progression 0-110 degrees today.Very challenging to step up on step with his L LE first without extensive use of UEs to assist.   OBJECTIVE IMPAIRMENTS: Abnormal gait, decreased activity tolerance, decreased mobility, decreased ROM, decreased strength, increased edema, and pain.   ACTIVITY LIMITATIONS: carrying, lifting, bending, standing, stairs, bed mobility, dressing, and locomotion level  PARTICIPATION LIMITATIONS: meal prep, cleaning, laundry, driving, shopping, community activity, occupation, and yard work  PERSONAL FACTORS: Time since onset of injury/illness/exacerbation are also affecting patient's functional outcome.   REHAB POTENTIAL: Good  CLINICAL DECISION MAKING: Stable/uncomplicated  EVALUATION COMPLEXITY: Low   GOALS:  SHORT TERM GOALS: Target date: 10/13/24  Ind with an initial HEP.  10/17/24: bike, lunges on steps, SLR Goal status: MET  2.  Full active left knee extension.  10/17/24: full ext in standing Goal status: MET  3.  Active left knee flexion to 90 degrees.   10/17/24: 110 deg after lunges on 14 box Goal status: MET   LONG TERM GOALS: Target date: 11/10/24  Ind with an advanced HEP.  Goal status: INITIAL  2.  Active left knee flexion to 115 degrees+ so the patient can perform functional tasks and do so with pain not > 2-3/10.  Goal status: INITIAL  3.  Increase left hip and knee strength to a solid 4+/5 to provide good stability for accomplishment of functional activities. Goal status: INITIAL  4.  Perform a reciprocating stair gait with one railing with pain not > 2-3/10.  Goal status: INITIAL  5.  Improve LEFS score by at least 30 points.  Goal status: INITIAL  PLAN:  PT  FREQUENCY: 2x/week  PT DURATION: 6 weeks  PLANNED INTERVENTIONS: 97110-Therapeutic exercises, 97530- Therapeutic activity, [id number]- Neuromuscular re-education, 97535- Self Care, 02859- Manual therapy, [id number]- Electrical stimulation (unattended), 97016- Vasopneumatic device, Patient/Family education, and Cryotherapy  PLAN FOR NEXT SESSION: Nustep, PROM, VMS to quads PRN.  Vasopneumatic.     Shamra Bradeen,CHRIS, PTA, DPT 10/19/2024, 5:30 PM

## 2024-10-23 ENCOUNTER — Ambulatory Visit: Admitting: *Deleted

## 2024-10-23 DIAGNOSIS — M6281 Muscle weakness (generalized): Secondary | ICD-10-CM

## 2024-10-23 DIAGNOSIS — M25562 Pain in left knee: Secondary | ICD-10-CM | POA: Diagnosis not present

## 2024-10-23 DIAGNOSIS — R600 Localized edema: Secondary | ICD-10-CM

## 2024-10-23 DIAGNOSIS — G8929 Other chronic pain: Secondary | ICD-10-CM

## 2024-10-23 NOTE — Therapy (Signed)
 OUTPATIENT PHYSICAL THERAPY LOWER EXTREMITY TREATMENT  Patient Name: Roberts Bon MRN: [id number] DOB:Jan 13, 1971, 53 y.o., male Today's Date: 10/23/2024  END OF SESSION:  PT End of Session - 10/23/24 1142     Visit Number 8    Number of Visits 12    Date for Recertification  11/10/24    PT Start Time 1100    PT Stop Time 1202    PT Time Calculation (min) 62 min             Past Medical History:  Diagnosis Date   Arthritis    CAD (coronary artery disease), native coronary artery    Coronary CTA showed mild nonobstructive CAD 25 to 49% stenosis in the mid RCA.   Complication of anesthesia    woke up early during dental surgery- tried to run away   Diabetes mellitus type 2, controlled (HCC)    Family history of adverse reaction to anesthesia    strong family history of MVP   GERD (gastroesophageal reflux disease) 04/05/2012   Hiatal hernia 04/05/2012   Hyperlipidemia 12/01/2012   HE PREFERS TO USE NIACIN AND BRAN TO LOWER HIS TRIGLYCERIDES AND CHOLESTEROL   Obesity    OSA on CPAP 04/05/2012   RSD lower limb    Testosterone deficiency    Past Surgical History:  Procedure Laterality Date   CHOLECYSTECTOMY     COLONOSCOPY     FINGER SURGERY     HERNIA REPAIR     x2   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   PARTIAL KNEE ARTHROPLASTY Left 09/26/2024   Procedure: ARTHROPLASTY, KNEE, UNICOMPARTMENTAL;  Surgeon: Josefina Chew, MD;  Location: [facility];  Service: Orthopedics;  Laterality: Left;   WISDOM TOOTH EXTRACTION     Patient Active Problem List   Diagnosis Date Noted   Chronic pain of left knee 11/01/2023   Encounter for general adult medical examination with abnormal findings 08/23/2023   Diabetes mellitus treated with insulin  and oral medication (HCC) 08/23/2023   Screening PSA (prostate specific antigen) 08/23/2023   Long-term current use of injectable noninsulin antidiabetic medication 08/23/2023   Diabetes mellitus, type 2 (HCC)  03/31/2022   Obesity 03/31/2022   Testosterone deficiency 03/31/2022   Ventral incisional hernia 03/26/2022   CAD (coronary artery disease), native coronary artery 02/05/2022   OSA (obstructive sleep apnea) 09/22/2017   Hyperlipidemia 12/01/2012   GERD (gastroesophageal reflux disease) 04/05/2012   Hiatal hernia 04/05/2012   Obstructive sleep apnea (adult) (pediatric) 04/05/2012   REFERRING PROVIDER: Chew Josefina MD  REFERRING DIAG: S/p left unilateral knee arthroplasty.    THERAPY DIAG:  Chronic pain of left knee  Localized edema  Muscle weakness (generalized)  Rationale for Evaluation and Treatment: Rehabilitation  ONSET DATE: 09/26/24 (surgery date).    SUBJECTIVE:   SUBJECTIVE STATEMENT: Has been doing the bike at home and stair lunges/heel slides.Knee is tight PERTINENT HISTORY: See above.  Right LE surgery (1998). PAIN:  Are you having pain? Yes: NPRS scale: 8/10.   Pain location: Left knee.   Pain description: Sharp.   Aggravating factors: As above.   Relieving factors: As above.    PRECAUTIONS: Other: No U/S.    RED FLAGS: None   WEIGHT BEARING RESTRICTIONS: No  FALLS:  Has patient fallen in last 6 months? No  LIVING ENVIRONMENT: Lives with: lives with their spouse Lives in: House/apartment Has following equipment at home: Vannie - 4 wheeled  PLOF: Independent  PATIENT GOALS: Perform ADL's without knee  pain.    OBJECTIVE:   PATIENT SURVEYS:  LEFS:  10/50.    EDEMA:  Circumferential: Left 4 cms > right.  PALPATION: Diffuse left knee pain.  Post-surgical dressing intact.    LOWER EXTREMITY ROM: (Eval) In supine:  Left knee extension to -5 degrees and initial flexion to 40 degrees.  He achieved 55 degrees after 10 minutes on Nustep.   10/17/24: knee flexion 100 deg on recumbent bike  LOWER EXTREMITY MMT:  The patient is currently unable to perform an antigravity left SLR and SAQ.    GAIT: The patient is walking safely with a Rolator  with very good heel strike.                                                                                                                                  TREATMENT DATE:  10/23/24:                                   EXERCISE LOG   LT knee  Exercise Repetitions and Resistance Comments  Recumbent bike 15 min   seat 8,   Box lunges 14 x 5 minutes    Fwd step tap 6 step 2x10   Side step up/down with L LE x10 With UE assist  Side step tap 6 step 2x10   Leg press Level 2,   3  x 10    seat 7    SAQs    SLR   x10   Manual PROM for extension ROM to full extension end of session  LE elevation and vasopneumatic on low to patient's left knee x 15 minutes.    10/11/24:                                   EXERCISE LOG  Exercise Repetitions and Resistance Comments  Nustep 15 minutes    Box lunges 14 x 5 minutes    Standing ham curls in parallel bars 3 minutes   SAQ's 1# x 6 minutes    Heel slides with sheet 3 minutes.       LE elevation and vasopneumatic on low to patient's left knee x 20 minutes.    10/09/24:                                     EXERCISE LOG  Exercise Repetitions and Resistance Comments  Nustep  Level 1 x 15 minutes progressing to seat 10   Box lunges 14 inches in parallel bars x 4 minutes    Seated chair scoots with foot against wall 3 minutes   Knee glide 4 minutes    Self flexion stretch using right LE 2 minutes   SAQ's  3 minutes   In supine:  PROM x 7 minutes into left knee flexion and extension f/b LE elevation and vasopneumatic on low to patient's left knee.      Nustep level 1 x 15 minutes starting at seat 12,11    supine knee glide x 5 minutes   Manual STW to quads and patella mobs  PROM into flexion and extension   x 12  minutes   80 degrees today   SAQ's x 3 minutes    LE elevation and vasopneumatic on low to left knee.      PATIENT EDUCATION:  Education details: Patient instructed to be compliant to his HEP. Person educated:  Patient Education method: Explanation Education comprehension: verbalized understanding  HOME EXERCISE PROGRAM:   ASSESSMENT:  CLINICAL IMPRESSION: Rx focused again  on  Continuing to work on improving quad and LE strength as well as  ROM progression 0-110 degrees today.    OBJECTIVE IMPAIRMENTS: Abnormal gait, decreased activity tolerance, decreased mobility, decreased ROM, decreased strength, increased edema, and pain.   ACTIVITY LIMITATIONS: carrying, lifting, bending, standing, stairs, bed mobility, dressing, and locomotion level  PARTICIPATION LIMITATIONS: meal prep, cleaning, laundry, driving, shopping, community activity, occupation, and yard work  PERSONAL FACTORS: Time since onset of injury/illness/exacerbation are also affecting patient's functional outcome.   REHAB POTENTIAL: Good  CLINICAL DECISION MAKING: Stable/uncomplicated  EVALUATION COMPLEXITY: Low   GOALS:  SHORT TERM GOALS: Target date: 10/13/24  Ind with an initial HEP.  10/17/24: bike, lunges on steps, SLR Goal status: MET  2.  Full active left knee extension.  10/17/24: full ext in standing Goal status: MET  3.  Active left knee flexion to 90 degrees.   10/17/24: 110 deg after lunges on 14 box Goal status: MET   LONG TERM GOALS: Target date: 11/10/24  Ind with an advanced HEP.  Goal status: INITIAL  2.  Active left knee flexion to 115 degrees+ so the patient can perform functional tasks and do so with pain not > 2-3/10.  Goal status: INITIAL  3.  Increase left hip and knee strength to a solid 4+/5 to provide good stability for accomplishment of functional activities. Goal status: INITIAL  4.  Perform a reciprocating stair gait with one railing with pain not > 2-3/10.  Goal status: INITIAL  5.  Improve LEFS score by at least 30 points.  Goal status: INITIAL  PLAN:  PT FREQUENCY: 2x/week  PT DURATION: 6 weeks  PLANNED INTERVENTIONS: 97110-Therapeutic exercises, 97530- Therapeutic  activity, [id number]- Neuromuscular re-education, 97535- Self Care, 02859- Manual therapy, G0283- Electrical stimulation (unattended), 97016- Vasopneumatic device, Patient/Family education, and Cryotherapy  PLAN FOR NEXT SESSION: Nustep, PROM, VMS to quads PRN.   Vasopneumatic.     Matheson Vandehei,CHRIS, PTA,  10/23/2024, 12:20 PM

## 2024-10-25 ENCOUNTER — Ambulatory Visit: Admitting: Physical Therapy

## 2024-10-25 DIAGNOSIS — M25562 Pain in left knee: Secondary | ICD-10-CM | POA: Diagnosis not present

## 2024-10-25 DIAGNOSIS — R600 Localized edema: Secondary | ICD-10-CM

## 2024-10-25 DIAGNOSIS — M25662 Stiffness of left knee, not elsewhere classified: Secondary | ICD-10-CM

## 2024-10-25 DIAGNOSIS — G8929 Other chronic pain: Secondary | ICD-10-CM

## 2024-10-25 DIAGNOSIS — M6281 Muscle weakness (generalized): Secondary | ICD-10-CM

## 2024-10-25 NOTE — Therapy (Signed)
 OUTPATIENT PHYSICAL THERAPY LOWER EXTREMITY TREATMENT  Patient Name: Jorge [name] MRN: [id number] DOB:01/04/1971, 53 y.o., male Today's Date: 10/25/2024  END OF SESSION:  PT End of Session - 10/25/24 1519     Visit Number 9    Number of Visits 12    Date for Recertification  11/10/24    PT Start Time 0309    PT Stop Time 0415    PT Time Calculation (min) 66 min    Activity Tolerance Patient tolerated treatment well    Behavior During Therapy [facility] for tasks assessed/performed             Past Medical History:  Diagnosis Date   Arthritis    CAD (coronary artery disease), native coronary artery    Coronary CTA showed mild nonobstructive CAD 25 to 49% stenosis in the mid RCA.   Complication of anesthesia    woke up early during dental surgery- tried to run away   Diabetes mellitus type 2, controlled (HCC)    Family history of adverse reaction to anesthesia    strong family history of MVP   GERD (gastroesophageal reflux disease) 04/05/2012   Hiatal hernia 04/05/2012   Hyperlipidemia 12/01/2012   HE PREFERS TO USE NIACIN AND BRAN TO LOWER HIS TRIGLYCERIDES AND CHOLESTEROL   Obesity    OSA on CPAP 04/05/2012   RSD lower limb    Testosterone deficiency    Past Surgical History:  Procedure Laterality Date   CHOLECYSTECTOMY     COLONOSCOPY     FINGER SURGERY     HERNIA REPAIR     x2   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   PARTIAL KNEE ARTHROPLASTY Left 09/26/2024   Procedure: ARTHROPLASTY, KNEE, UNICOMPARTMENTAL;  Surgeon: Josefina Chew, MD;  Location: [facility];  Service: Orthopedics;  Laterality: Left;   WISDOM TOOTH EXTRACTION     Patient Active Problem List   Diagnosis Date Noted   Chronic pain of left knee 11/01/2023   Encounter for general adult medical examination with abnormal findings 08/23/2023   Diabetes mellitus treated with insulin  and oral medication (HCC) 08/23/2023   Screening PSA (prostate specific antigen) 08/23/2023    Long-term current use of injectable noninsulin antidiabetic medication 08/23/2023   Diabetes mellitus, type 2 (HCC) 03/31/2022   Obesity 03/31/2022   Testosterone deficiency 03/31/2022   Ventral incisional hernia 03/26/2022   CAD (coronary artery disease), native coronary artery 02/05/2022   OSA (obstructive sleep apnea) 09/22/2017   Hyperlipidemia 12/01/2012   GERD (gastroesophageal reflux disease) 04/05/2012   Hiatal hernia 04/05/2012   Obstructive sleep apnea (adult) (pediatric) 04/05/2012   REFERRING PROVIDER: Chew Josefina MD  REFERRING DIAG: S/p left unilateral knee arthroplasty.    THERAPY DIAG:  Chronic pain of left knee  Localized edema  Muscle weakness (generalized)  Stiffness of left knee, not elsewhere classified  Rationale for Evaluation and Treatment: Rehabilitation  ONSET DATE: 09/26/24 (surgery date).    SUBJECTIVE:   SUBJECTIVE STATEMENT: No new complaints.  PERTINENT HISTORY: See above.  Right LE surgery (1998). PAIN:  Are you having pain? Yes: NPRS scale: 8/10.   Pain location: Left knee.   Pain description: Sharp.   Aggravating factors: As above.   Relieving factors: As above.    PRECAUTIONS: Other: No U/S.    RED FLAGS: None   WEIGHT BEARING RESTRICTIONS: No  FALLS:  Has patient fallen in last 6 months? No  LIVING ENVIRONMENT: Lives with: lives with their spouse Lives in: House/apartment Has  following equipment at home: Vannie - 4 wheeled  PLOF: Independent  PATIENT GOALS: Perform ADL's without knee pain.    OBJECTIVE:   PATIENT SURVEYS:  LEFS:  10/50.    EDEMA:  Circumferential: Left 4 cms > right.  PALPATION: Diffuse left knee pain.  Post-surgical dressing intact.    LOWER EXTREMITY ROM: (Eval) In supine:  Left knee extension to -5 degrees and initial flexion to 40 degrees.  He achieved 55 degrees after 10 minutes on Nustep.   10/17/24: knee flexion 100 deg on recumbent bike **10/25/24:  Passive left knee flexion to 115  degrees.  LOWER EXTREMITY MMT:  The patient is currently unable to perform an antigravity left SLR and SAQ.    GAIT: The patient is walking safely with a Rolator with very good heel strike.                                                                                                                                  TREATMENT DATE:   10/25/24:                                     EXERCISE LOG  Exercise Repetitions and Resistance Comments  Recumbent bike Progressing to seat 6 x 15 minutes    4 inch step-ups In parallel bars x 4 minutes    Knee ext 10# x 3 minutes   Ham curls 40# x 4 minutes    Leg Press 3 plates x 3 minutes (seat 7).     Sit to stands from mat table 2 minutes    Mini wall slides  2 minutes    TKE's with ball against wall 3 minutes    2 minute flexion stretch and passive left knee flexion to 115 degrees. LE elevation and vasopneumatic on low to patient's left knee x 15 minutes.   10/23/24:                                   EXERCISE LOG   LT knee  Exercise Repetitions and Resistance Comments  Recumbent bike 15 min   seat 8,   Box lunges 14 x 5 minutes    Fwd step tap 6 step 2x10   Side step up/down with L LE x10 With UE assist  Side step tap 6 step 2x10   Leg press Level 2,   3  x 10    seat 7    SAQs    SLR   x10   Manual PROM for extension ROM to full extension end of session  LE elevation and vasopneumatic on low to patient's left knee x 15 minutes.    10/11/24:  EXERCISE LOG  Exercise Repetitions and Resistance Comments  Nustep 15 minutes    Box lunges 14 x 5 minutes    Standing ham curls in parallel bars 3 minutes   SAQ's 1# x 6 minutes    Heel slides with sheet 3 minutes.       LE elevation and vasopneumatic on low to patient's left knee x 20 minutes.     PATIENT EDUCATION:  Education details: See below. Person educated: Patient Education method: Explanation Education comprehension: verbalized  understanding, handout  HOME EXERCISE PROGRAM: HOME EXERCISE PROGRAM [962MHUR]  Standing Wall squats -  Repeat 15 Repetitions, Hold 2 Seconds, Complete 2 Sets, Perform 2 Times a Day  BALL TKE - TERMINAL KNEE EXTENSION -  Repeat 10 Repetitions, Hold 1 Second(s), Complete 2 Sets, Perform 2 Times a Day  ASSESSMENT:  CLINICAL IMPRESSION: Patient remians highly motivated.  He progressed to seat 6 on the recumbent bike.  He archived passive left knee flexion to 115 degrees.   OBJECTIVE IMPAIRMENTS: Abnormal gait, decreased activity tolerance, decreased mobility, decreased ROM, decreased strength, increased edema, and pain.   ACTIVITY LIMITATIONS: carrying, lifting, bending, standing, stairs, bed mobility, dressing, and locomotion level  PARTICIPATION LIMITATIONS: meal prep, cleaning, laundry, driving, shopping, community activity, occupation, and yard work  PERSONAL FACTORS: Time since onset of injury/illness/exacerbation are also affecting patient's functional outcome.   REHAB POTENTIAL: Good  CLINICAL DECISION MAKING: Stable/uncomplicated  EVALUATION COMPLEXITY: Low   GOALS:  SHORT TERM GOALS: Target date: 10/13/24  Ind with an initial HEP.  10/17/24: bike, lunges on steps, SLR Goal status: MET  2.  Full active left knee extension.  10/17/24: full ext in standing Goal status: MET  3.  Active left knee flexion to 90 degrees.   10/17/24: 110 deg after lunges on 14 box Goal status: MET   LONG TERM GOALS: Target date: 11/10/24  Ind with an advanced HEP.  Goal status: INITIAL  2.  Active left knee flexion to 115 degrees+ so the patient can perform functional tasks and do so with pain not > 2-3/10.  Goal status: INITIAL  3.  Increase left hip and knee strength to a solid 4+/5 to provide good stability for accomplishment of functional activities. Goal status: INITIAL  4.  Perform a reciprocating stair gait with one railing with pain not > 2-3/10.  Goal status:  INITIAL  5.  Improve LEFS score by at least 30 points.  Goal status: INITIAL  PLAN:  PT FREQUENCY: 2x/week  PT DURATION: 6 weeks  PLANNED INTERVENTIONS: 97110-Therapeutic exercises, 97530- Therapeutic activity, [id number]- Neuromuscular re-education, 97535- Self Care, 02859- Manual therapy, G0283- Electrical stimulation (unattended), 97016- Vasopneumatic device, Patient/Family education, and Cryotherapy  PLAN FOR NEXT SESSION: Nustep, PROM, VMS to quads PRN.   Vasopneumatic.     Dioselina Brumbaugh, PT,  10/25/2024, 4:16 PM

## 2024-10-30 ENCOUNTER — Ambulatory Visit

## 2024-10-30 ENCOUNTER — Encounter: Payer: Self-pay | Admitting: Physical Therapy

## 2024-10-30 DIAGNOSIS — M6281 Muscle weakness (generalized): Secondary | ICD-10-CM

## 2024-10-30 DIAGNOSIS — R600 Localized edema: Secondary | ICD-10-CM

## 2024-10-30 DIAGNOSIS — M25562 Pain in left knee: Secondary | ICD-10-CM | POA: Diagnosis not present

## 2024-10-30 DIAGNOSIS — G8929 Other chronic pain: Secondary | ICD-10-CM

## 2024-10-30 DIAGNOSIS — M25662 Stiffness of left knee, not elsewhere classified: Secondary | ICD-10-CM

## 2024-10-30 NOTE — Therapy (Signed)
 OUTPATIENT PHYSICAL THERAPY LOWER EXTREMITY TREATMENT  Patient Name: Jorge [name] MRN: [id number] DOB:November 01, 1971, 53 y.o., male Today's Date: 10/30/2024  END OF SESSION:  PT End of Session - 10/30/24 1522     Visit Number 10    Number of Visits 12    Date for Recertification  11/10/24    PT Start Time 1515    PT Stop Time 1600    PT Time Calculation (min) 45 min    Activity Tolerance Patient tolerated treatment well    Behavior During Therapy [facility] for tasks assessed/performed          Past Medical History:  Diagnosis Date   Arthritis    CAD (coronary artery disease), native coronary artery    Coronary CTA showed mild nonobstructive CAD 25 to 49% stenosis in the mid RCA.   Complication of anesthesia    woke up early during dental surgery- tried to run away   Diabetes mellitus type 2, controlled (HCC)    Family history of adverse reaction to anesthesia    strong family history of MVP   GERD (gastroesophageal reflux disease) 04/05/2012   Hiatal hernia 04/05/2012   Hyperlipidemia 12/01/2012   HE PREFERS TO USE NIACIN AND BRAN TO LOWER HIS TRIGLYCERIDES AND CHOLESTEROL   Obesity    OSA on CPAP 04/05/2012   RSD lower limb    Testosterone deficiency    Past Surgical History:  Procedure Laterality Date   CHOLECYSTECTOMY     COLONOSCOPY     FINGER SURGERY     HERNIA REPAIR     x2   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   PARTIAL KNEE ARTHROPLASTY Left 09/26/2024   Procedure: ARTHROPLASTY, KNEE, UNICOMPARTMENTAL;  Surgeon: Josefina Chew, MD;  Location: [facility];  Service: Orthopedics;  Laterality: Left;   WISDOM TOOTH EXTRACTION     Patient Active Problem List   Diagnosis Date Noted   Chronic pain of left knee 11/01/2023   Encounter for general adult medical examination with abnormal findings 08/23/2023   Diabetes mellitus treated with insulin  and oral medication (HCC) 08/23/2023   Screening PSA (prostate specific antigen) 08/23/2023   Long-term  current use of injectable noninsulin antidiabetic medication 08/23/2023   Diabetes mellitus, type 2 (HCC) 03/31/2022   Obesity 03/31/2022   Testosterone deficiency 03/31/2022   Ventral incisional hernia 03/26/2022   CAD (coronary artery disease), native coronary artery 02/05/2022   OSA (obstructive sleep apnea) 09/22/2017   Hyperlipidemia 12/01/2012   GERD (gastroesophageal reflux disease) 04/05/2012   Hiatal hernia 04/05/2012   Obstructive sleep apnea (adult) (pediatric) 04/05/2012   REFERRING PROVIDER: Chew Josefina MD  REFERRING DIAG: S/p left unilateral knee arthroplasty.    THERAPY DIAG:  Chronic pain of left knee  Localized edema  Muscle weakness (generalized)  Stiffness of left knee, not elsewhere classified  Rationale for Evaluation and Treatment: Rehabilitation  ONSET DATE: 09/26/24 (surgery date).    SUBJECTIVE:   SUBJECTIVE STATEMENT: Pt states he thinks he might have over done it on Saturday. Still feels weak with going up 6 steps. Can do 4 steps  PERTINENT HISTORY: See above.  Right LE surgery (1998). PAIN:  Are you having pain? Yes: NPRS scale: 8/10.   Pain location: Left knee.   Pain description: Sharp.   Aggravating factors: As above.   Relieving factors: As above.    PRECAUTIONS: Other: No U/S.    RED FLAGS: None   WEIGHT BEARING RESTRICTIONS: No  FALLS:  Has patient fallen  in last 6 months? No  LIVING ENVIRONMENT: Lives with: lives with their spouse Lives in: House/apartment Has following equipment at home: Vannie - 4 wheeled  PLOF: Independent  PATIENT GOALS: Perform ADL's without knee pain.    OBJECTIVE:   PATIENT SURVEYS:  LEFS:  10/50.    EDEMA:  Circumferential: Left 4 cms > right.  PALPATION: Diffuse left knee pain.  Post-surgical dressing intact.    LOWER EXTREMITY ROM: (Eval) In supine:  Left knee extension to -5 degrees and initial flexion to 40 degrees.  He achieved 55 degrees after 10 minutes on Nustep.    10/17/24: knee flexion 100 deg on recumbent bike 10/25/24:  Passive left knee flexion to 115 degrees.  LOWER EXTREMITY MMT:  The patient is currently unable to perform an antigravity left SLR and SAQ.    GAIT: The patient is walking safely with a Rolator with very good heel strike.                                                                                                                                  TREATMENT DATE:   10/30/24:                                     EXERCISE LOG  Exercise Repetitions and Resistance Comments  Recumbent bike Progressing to seat 3 x 15 minutes    Self care  Checking knee ROM at home and discussing HEP modifications; overdoing and going to knee brace for a little bit; importance of knee strengthening with knee ROM  Standing gastroc stretch X30   Standing soleus stretch X30   Staggered sit<>stand from mat table x10   Seated hamstring curl 50# 3x10 Both legs  Seated knee ext 10# eccentrics 3x10 L LE only  Supine knee flexion to chest with end range hamstring curl 2x10    LE elevation and vasopneumatic on low to patient's left knee x 15 minutes.   PATIENT EDUCATION:  Education details: See below. Person educated: Patient Education method: Explanation Education comprehension: verbalized understanding, handout  HOME EXERCISE PROGRAM: HOME EXERCISE PROGRAM [962MHUR]  Standing Wall squats -  Repeat 15 Repetitions, Hold 2 Seconds, Complete 2 Sets, Perform 2 Times a Day  BALL TKE - TERMINAL KNEE EXTENSION -  Repeat 10 Repetitions, Hold 1 Second(s), Complete 2 Sets, Perform 2 Times a Day  ASSESSMENT:  CLINICAL IMPRESSION: Patient remians highly motivated.  Working on improving active knee ROM and strength this session.    OBJECTIVE IMPAIRMENTS: Abnormal gait, decreased activity tolerance, decreased mobility, decreased ROM, decreased strength, increased edema, and pain.   ACTIVITY LIMITATIONS: carrying, lifting, bending, standing, stairs,  bed mobility, dressing, and locomotion level  PARTICIPATION LIMITATIONS: meal prep, cleaning, laundry, driving, shopping, community activity, occupation, and yard work  PERSONAL FACTORS: Time since onset of injury/illness/exacerbation are also affecting patient's functional outcome.  REHAB POTENTIAL: Good  CLINICAL DECISION MAKING: Stable/uncomplicated  EVALUATION COMPLEXITY: Low   GOALS:  SHORT TERM GOALS: Target date: 10/13/24  Ind with an initial HEP.  10/17/24: bike, lunges on steps, SLR Goal status: MET  2.  Full active left knee extension.  10/17/24: full ext in standing Goal status: MET  3.  Active left knee flexion to 90 degrees.   10/17/24: 110 deg after lunges on 14 box Goal status: MET   LONG TERM GOALS: Target date: 11/10/24  Ind with an advanced HEP.  Goal status: INITIAL  2.  Active left knee flexion to 115 degrees+ so the patient can perform functional tasks and do so with pain not > 2-3/10.  Goal status: INITIAL  3.  Increase left hip and knee strength to a solid 4+/5 to provide good stability for accomplishment of functional activities. Goal status: INITIAL  4.  Perform a reciprocating stair gait with one railing with pain not > 2-3/10.  Goal status: INITIAL  5.  Improve LEFS score by at least 30 points.  Goal status: INITIAL  PLAN:  PT FREQUENCY: 2x/week  PT DURATION: 6 weeks  PLANNED INTERVENTIONS: 97110-Therapeutic exercises, 97530- Therapeutic activity, [id number]- Neuromuscular re-education, 97535- Self Care, 02859- Manual therapy, G0283- Electrical stimulation (unattended), 97016- Vasopneumatic device, Patient/Family education, and Cryotherapy  PLAN FOR NEXT SESSION: Nustep, PROM, VMS to quads PRN.   Vasopneumatic.     Jimmey Hengel April Ma L Izear Pine, PT,  10/30/2024, 4:20 PM

## 2024-10-30 NOTE — Therapy (Deleted)
 OUTPATIENT PHYSICAL THERAPY LOWER EXTREMITY TREATMENT  Patient Name: Jorge [name] MRN: [id number] DOB:[birth year], 53 y.o., male Today's Date: 10/30/2024  END OF SESSION:       Past Medical History:  Diagnosis Date   Arthritis    CAD (coronary artery disease), native coronary artery    Coronary CTA showed mild nonobstructive CAD 25 to 49% stenosis in the mid RCA.   Complication of anesthesia    woke up early during dental surgery- tried to run away   Diabetes mellitus type 2, controlled (HCC)    Family history of adverse reaction to anesthesia    strong family history of MVP   GERD (gastroesophageal reflux disease) 04/05/2012   Hiatal hernia 04/05/2012   Hyperlipidemia 12/01/2012   HE PREFERS TO USE NIACIN AND BRAN TO LOWER HIS TRIGLYCERIDES AND CHOLESTEROL   Obesity    OSA on CPAP 04/05/2012   RSD lower limb    Testosterone deficiency    Past Surgical History:  Procedure Laterality Date   CHOLECYSTECTOMY     COLONOSCOPY     FINGER SURGERY     HERNIA REPAIR     x2   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   PARTIAL KNEE ARTHROPLASTY Left 09/26/2024   Procedure: ARTHROPLASTY, KNEE, UNICOMPARTMENTAL;  Surgeon: Josefina Chew, MD;  Location: [facility];  Service: Orthopedics;  Laterality: Left;   WISDOM TOOTH EXTRACTION     Patient Active Problem List   Diagnosis Date Noted   Chronic pain of left knee 11/01/2023   Encounter for general adult medical examination with abnormal findings 08/23/2023   Diabetes mellitus treated with insulin  and oral medication (HCC) 08/23/2023   Screening PSA (prostate specific antigen) 08/23/2023   Long-term current use of injectable noninsulin antidiabetic medication 08/23/2023   Diabetes mellitus, type 2 (HCC) 03/31/2022   Obesity 03/31/2022   Testosterone deficiency 03/31/2022   Ventral incisional hernia 03/26/2022   CAD (coronary artery disease), native coronary artery 02/05/2022   OSA (obstructive sleep apnea)  09/22/2017   Hyperlipidemia 12/01/2012   GERD (gastroesophageal reflux disease) 04/05/2012   Hiatal hernia 04/05/2012   Obstructive sleep apnea (adult) (pediatric) 04/05/2012   REFERRING PROVIDER: Chew Josefina MD  REFERRING DIAG: S/p left unilateral knee arthroplasty.    THERAPY DIAG:  No diagnosis found.  Rationale for Evaluation and Treatment: Rehabilitation  ONSET DATE: 09/26/24 (surgery date).    SUBJECTIVE:   SUBJECTIVE STATEMENT: No new complaints.  PERTINENT HISTORY: See above.  Right LE surgery (1998). PAIN:  Are you having pain? Yes: NPRS scale: 8/10.   Pain location: Left knee.   Pain description: Sharp.   Aggravating factors: As above.   Relieving factors: As above.    PRECAUTIONS: Other: No U/S.    RED FLAGS: None   WEIGHT BEARING RESTRICTIONS: No  FALLS:  Has patient fallen in last 6 months? No  LIVING ENVIRONMENT: Lives with: lives with their spouse Lives in: House/apartment Has following equipment at home: Vannie - 4 wheeled  PLOF: Independent  PATIENT GOALS: Perform ADL's without knee pain.    OBJECTIVE:   PATIENT SURVEYS:  LEFS:  10/50.    EDEMA:  Circumferential: Left 4 cms > right.  PALPATION: Diffuse left knee pain.  Post-surgical dressing intact.    LOWER EXTREMITY ROM: (Eval) In supine:  Left knee extension to -5 degrees and initial flexion to 40 degrees.  He achieved 55 degrees after 10 minutes on Nustep.   10/17/24: knee flexion 100 deg on recumbent bike **10/25/24:  Passive left knee flexion to 115 degrees.  LOWER EXTREMITY MMT:  The patient is currently unable to perform an antigravity left SLR and SAQ.    GAIT: The patient is walking safely with a Rolator with very good heel strike.                                                                                                                                  TREATMENT DATE:   10/25/24:                                     EXERCISE LOG  Exercise Repetitions  and Resistance Comments  Recumbent bike Progressing to seat 6 x 15 minutes    4 inch step-ups In parallel bars x 4 minutes    Knee ext 10# x 3 minutes   Ham curls 40# x 4 minutes    Leg Press 3 plates x 3 minutes (seat 7).     Sit to stands from mat table 2 minutes    Mini wall slides  2 minutes    TKE's with ball against wall 3 minutes    2 minute flexion stretch and passive left knee flexion to 115 degrees. LE elevation and vasopneumatic on low to patient's left knee x 15 minutes.   10/23/24:                                   EXERCISE LOG   LT knee  Exercise Repetitions and Resistance Comments  Recumbent bike 15 min   seat 8,   Box lunges 14 x 5 minutes    Fwd step tap 6 step 2x10   Side step up/down with L LE x10 With UE assist  Side step tap 6 step 2x10   Leg press Level 2,   3  x 10    seat 7    SAQs    SLR   x10   Manual PROM for extension ROM to full extension end of session  LE elevation and vasopneumatic on low to patient's left knee x 15 minutes.    10/11/24:                                   EXERCISE LOG  Exercise Repetitions and Resistance Comments  Nustep 15 minutes    Box lunges 14 x 5 minutes    Standing ham curls in parallel bars 3 minutes   SAQ's 1# x 6 minutes    Heel slides with sheet 3 minutes.       LE elevation and vasopneumatic on low to patient's left knee x 20 minutes.     PATIENT EDUCATION:  Education details: See below. Person educated: Patient Education  method: Explanation Education comprehension: verbalized understanding, handout  HOME EXERCISE PROGRAM: HOME EXERCISE PROGRAM [962MHUR]  Standing Wall squats -  Repeat 15 Repetitions, Hold 2 Seconds, Complete 2 Sets, Perform 2 Times a Day  BALL TKE - TERMINAL KNEE EXTENSION -  Repeat 10 Repetitions, Hold 1 Second(s), Complete 2 Sets, Perform 2 Times a Day  ASSESSMENT:  CLINICAL IMPRESSION: Patient remians highly motivated.  He progressed to seat 6 on the recumbent bike.  He  archived passive left knee flexion to 115 degrees.   OBJECTIVE IMPAIRMENTS: Abnormal gait, decreased activity tolerance, decreased mobility, decreased ROM, decreased strength, increased edema, and pain.   ACTIVITY LIMITATIONS: carrying, lifting, bending, standing, stairs, bed mobility, dressing, and locomotion level  PARTICIPATION LIMITATIONS: meal prep, cleaning, laundry, driving, shopping, community activity, occupation, and yard work  PERSONAL FACTORS: Time since onset of injury/illness/exacerbation are also affecting patient's functional outcome.   REHAB POTENTIAL: Good  CLINICAL DECISION MAKING: Stable/uncomplicated  EVALUATION COMPLEXITY: Low   GOALS:  SHORT TERM GOALS: Target date: 10/13/24  Ind with an initial HEP.  10/17/24: bike, lunges on steps, SLR Goal status: MET  2.  Full active left knee extension.  10/17/24: full ext in standing Goal status: MET  3.  Active left knee flexion to 90 degrees.   10/17/24: 110 deg after lunges on 14 box Goal status: MET   LONG TERM GOALS: Target date: 11/10/24  Ind with an advanced HEP.  Goal status: INITIAL  2.  Active left knee flexion to 115 degrees+ so the patient can perform functional tasks and do so with pain not > 2-3/10.  Goal status: INITIAL  3.  Increase left hip and knee strength to a solid 4+/5 to provide good stability for accomplishment of functional activities. Goal status: INITIAL  4.  Perform a reciprocating stair gait with one railing with pain not > 2-3/10.  Goal status: INITIAL  5.  Improve LEFS score by at least 30 points.  Goal status: INITIAL  PLAN:  PT FREQUENCY: 2x/week  PT DURATION: 6 weeks  PLANNED INTERVENTIONS: 97110-Therapeutic exercises, 97530- Therapeutic activity, [id number]- Neuromuscular re-education, 97535- Self Care, 02859- Manual therapy, G0283- Electrical stimulation (unattended), 97016- Vasopneumatic device, Patient/Family education, and Cryotherapy  PLAN FOR NEXT SESSION:  Nustep, PROM, VMS to quads PRN.   Vasopneumatic.     Samarth Ogle April Ma L Rahkeem Senft, PT,  10/30/2024, 12:54 PM

## 2024-11-01 ENCOUNTER — Ambulatory Visit

## 2024-11-01 DIAGNOSIS — M25562 Pain in left knee: Secondary | ICD-10-CM | POA: Diagnosis not present

## 2024-11-01 DIAGNOSIS — M6281 Muscle weakness (generalized): Secondary | ICD-10-CM

## 2024-11-01 DIAGNOSIS — M25662 Stiffness of left knee, not elsewhere classified: Secondary | ICD-10-CM

## 2024-11-01 DIAGNOSIS — R600 Localized edema: Secondary | ICD-10-CM

## 2024-11-01 DIAGNOSIS — G8929 Other chronic pain: Secondary | ICD-10-CM

## 2024-11-01 NOTE — Therapy (Signed)
 OUTPATIENT PHYSICAL THERAPY LOWER EXTREMITY TREATMENT  Patient Name: Jorge [name] MRN: [id number] DOB:06/16/1971, 53 y.o., male Today's Date: 11/01/2024  END OF SESSION:  PT End of Session - 11/01/24 1520     Visit Number 11    Number of Visits 12    Date for Recertification  11/10/24    PT Start Time 1515    Activity Tolerance Patient tolerated treatment well    Behavior During Therapy [facility] for tasks assessed/performed          Past Medical History:  Diagnosis Date   Arthritis    CAD (coronary artery disease), native coronary artery    Coronary CTA showed mild nonobstructive CAD 25 to 49% stenosis in the mid RCA.   Complication of anesthesia    woke up early during dental surgery- tried to run away   Diabetes mellitus type 2, controlled (HCC)    Family history of adverse reaction to anesthesia    strong family history of MVP   GERD (gastroesophageal reflux disease) 04/05/2012   Hiatal hernia 04/05/2012   Hyperlipidemia 12/01/2012   HE PREFERS TO USE NIACIN AND BRAN TO LOWER HIS TRIGLYCERIDES AND CHOLESTEROL   Obesity    OSA on CPAP 04/05/2012   RSD lower limb    Testosterone deficiency    Past Surgical History:  Procedure Laterality Date   CHOLECYSTECTOMY     COLONOSCOPY     FINGER SURGERY     HERNIA REPAIR     x2   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   PARTIAL KNEE ARTHROPLASTY Left 09/26/2024   Procedure: ARTHROPLASTY, KNEE, UNICOMPARTMENTAL;  Surgeon: Josefina Chew, MD;  Location: [facility];  Service: Orthopedics;  Laterality: Left;   WISDOM TOOTH EXTRACTION     Patient Active Problem List   Diagnosis Date Noted   Chronic pain of left knee 11/01/2023   Encounter for general adult medical examination with abnormal findings 08/23/2023   Diabetes mellitus treated with insulin  and oral medication (HCC) 08/23/2023   Screening PSA (prostate specific antigen) 08/23/2023   Long-term current use of injectable noninsulin antidiabetic  medication 08/23/2023   Diabetes mellitus, type 2 (HCC) 03/31/2022   Obesity 03/31/2022   Testosterone deficiency 03/31/2022   Ventral incisional hernia 03/26/2022   CAD (coronary artery disease), native coronary artery 02/05/2022   OSA (obstructive sleep apnea) 09/22/2017   Hyperlipidemia 12/01/2012   GERD (gastroesophageal reflux disease) 04/05/2012   Hiatal hernia 04/05/2012   Obstructive sleep apnea (adult) (pediatric) 04/05/2012   REFERRING PROVIDER: Chew Josefina MD  REFERRING DIAG: S/p left unilateral knee arthroplasty.    THERAPY DIAG:  Chronic pain of left knee  Muscle weakness (generalized)  Localized edema  Stiffness of left knee, not elsewhere classified  Rationale for Evaluation and Treatment: Rehabilitation  ONSET DATE: 09/26/24 (surgery date).    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   PERTINENT HISTORY: See above.  Right LE surgery (1998). PAIN:  Are you having pain? No  PRECAUTIONS: Other: No U/S.    RED FLAGS: None   WEIGHT BEARING RESTRICTIONS: No  FALLS:  Has patient fallen in last 6 months? No  LIVING ENVIRONMENT: Lives with: lives with their spouse Lives in: House/apartment Has following equipment at home: Vannie - 4 wheeled  PLOF: Independent  PATIENT GOALS: Perform ADL's without knee pain.    OBJECTIVE:   PATIENT SURVEYS:  LEFS:  10/50.    EDEMA:  Circumferential:  Left 4 cms > right.  PALPATION: Diffuse left knee pain.  Post-surgical dressing intact.    LOWER EXTREMITY ROM: (Eval) In supine:  Left knee extension to -5 degrees and initial flexion to 40 degrees.  He achieved 55 degrees after 10 minutes on Nustep.   10/17/24: knee flexion 100 deg on recumbent bike 10/25/24:  Passive left knee flexion to 115 degrees.  LOWER EXTREMITY MMT:  The patient is currently unable to perform an antigravity left SLR and SAQ.    GAIT: The patient is walking safely with a  Rolator with very good heel strike.                                                                                                                                  TREATMENT DATE:    11/01/24                                  EXERCISE LOG  Exercise Repetitions and Resistance Comments  Recumbent Bike Progress to seat 3 x 15 mins   Cybex Knee Flexion 50# x 4 mins   Cybex Knee Extension 10# x 4 mins   Leg Press 2 plates; seat 4; x 3 mins   Lunges 14 box to fatigue   Hamstring Stretch 30 x 5 reps    Blank cell = exercise not performed today   Modalities  Date:  Vaso: Knee, 34 degrees; low pressure, 15 mins, Pain and Edema   10/30/24:                                     EXERCISE LOG  Exercise Repetitions and Resistance Comments  Recumbent bike Progressing to seat 3 x 15 minutes    Self care  Checking knee ROM at home and discussing HEP modifications; overdoing and going to knee brace for a little bit; importance of knee strengthening with knee ROM  Standing gastroc stretch X30   Standing soleus stretch X30   Staggered sit<>stand from mat table x10   Seated hamstring curl 50# 3x10 Both legs  Seated knee ext 10# eccentrics 3x10 L LE only  Supine knee flexion to chest with end range hamstring curl 2x10    LE elevation and vasopneumatic on low to patient's left knee x 15 minutes.   PATIENT EDUCATION:  Education details: See below. Person educated: Patient Education method: Explanation Education comprehension: verbalized understanding, handout  HOME EXERCISE PROGRAM: HOME EXERCISE PROGRAM [962MHUR]  Standing Wall squats -  Repeat 15 Repetitions, Hold 2 Seconds, Complete 2 Sets, Perform 2 Times a Day  BALL TKE - TERMINAL KNEE EXTENSION -  Repeat 10 Repetitions, Hold 1 Second(s), Complete 2 Sets, Perform 2 Times a Day  ASSESSMENT:  CLINICAL IMPRESSION: Pt arrives for today's treatment session denying any pain.  Pt remains highly motivated to improve.  Pt reports  increased difficulty with donning socks and shoes.  Pt instructed in hamstring stretches to assist with this activity.  Pt plans to go by [employer] after today's session.   OBJECTIVE IMPAIRMENTS: Abnormal gait, decreased activity tolerance, decreased mobility, decreased ROM, decreased strength, increased edema, and pain.   ACTIVITY LIMITATIONS: carrying, lifting, bending, standing, stairs, bed mobility, dressing, and locomotion level  PARTICIPATION LIMITATIONS: meal prep, cleaning, laundry, driving, shopping, community activity, occupation, and yard work  PERSONAL FACTORS: Time since onset of injury/illness/exacerbation are also affecting patient's functional outcome.   REHAB POTENTIAL: Good  CLINICAL DECISION MAKING: Stable/uncomplicated  EVALUATION COMPLEXITY: Low   GOALS:  SHORT TERM GOALS: Target date: 10/13/24  Ind with an initial HEP.  10/17/24: bike, lunges on steps, SLR Goal status: MET  2.  Full active left knee extension.  10/17/24: full ext in standing Goal status: MET  3.  Active left knee flexion to 90 degrees.   10/17/24: 110 deg after lunges on 14 box Goal status: MET   LONG TERM GOALS: Target date: 11/10/24  Ind with an advanced HEP.  Goal status: INITIAL  2.  Active left knee flexion to 115 degrees+ so the patient can perform functional tasks and do so with pain not > 2-3/10.  Goal status: INITIAL  3.  Increase left hip and knee strength to a solid 4+/5 to provide good stability for accomplishment of functional activities. Goal status: INITIAL  4.  Perform a reciprocating stair gait with one railing with pain not > 2-3/10.  Goal status: INITIAL  5.  Improve LEFS score by at least 30 points.  Goal status: INITIAL  PLAN:  PT FREQUENCY: 2x/week  PT DURATION: 6 weeks  PLANNED INTERVENTIONS: 97110-Therapeutic exercises, 97530- Therapeutic activity, [id number]- Neuromuscular re-education, 97535- Self Care, 02859- Manual therapy, G0283-  Electrical stimulation (unattended), 97016- Vasopneumatic device, Patient/Family education, and Cryotherapy  PLAN FOR NEXT SESSION: Nustep, PROM, VMS to quads PRN.   Vasopneumatic.     Delon DELENA Gosling, PTA,  11/01/2024, 4:17 PM

## 2024-11-05 ENCOUNTER — Other Ambulatory Visit: Payer: Self-pay | Admitting: Cardiology

## 2024-11-06 ENCOUNTER — Ambulatory Visit: Admitting: Physical Therapy

## 2024-11-07 ENCOUNTER — Ambulatory Visit: Admitting: Physical Therapy

## 2024-11-07 ENCOUNTER — Encounter: Payer: Self-pay | Admitting: Physical Therapy

## 2024-11-07 DIAGNOSIS — R600 Localized edema: Secondary | ICD-10-CM

## 2024-11-07 DIAGNOSIS — M25662 Stiffness of left knee, not elsewhere classified: Secondary | ICD-10-CM

## 2024-11-07 DIAGNOSIS — M25562 Pain in left knee: Secondary | ICD-10-CM | POA: Diagnosis not present

## 2024-11-07 DIAGNOSIS — G8929 Other chronic pain: Secondary | ICD-10-CM

## 2024-11-07 DIAGNOSIS — M6281 Muscle weakness (generalized): Secondary | ICD-10-CM

## 2024-11-07 NOTE — Therapy (Signed)
 " OUTPATIENT PHYSICAL THERAPY LOWER EXTREMITY TREATMENT AND RE-CERT  Patient Name: Jorge [name] MRN: [id number] DOB:1971/04/16, 53 y.o., male Today's Date: 11/07/2024  END OF SESSION:  PT End of Session - 11/07/24 1633     Visit Number 12    Number of Visits 12    Date for Recertification  11/10/24    PT Start Time 1600    PT Stop Time 1640    PT Time Calculation (min) 40 min    Activity Tolerance Patient tolerated treatment well    Behavior During Therapy [facility] for tasks assessed/performed           Past Medical History:  Diagnosis Date   Arthritis    CAD (coronary artery disease), native coronary artery    Coronary CTA showed mild nonobstructive CAD 25 to 49% stenosis in the mid RCA.   Complication of anesthesia    woke up early during dental surgery- tried to run away   Diabetes mellitus type 2, controlled (HCC)    Family history of adverse reaction to anesthesia    strong family history of MVP   GERD (gastroesophageal reflux disease) 04/05/2012   Hiatal hernia 04/05/2012   Hyperlipidemia 12/01/2012   HE PREFERS TO USE NIACIN AND BRAN TO LOWER HIS TRIGLYCERIDES AND CHOLESTEROL   Obesity    OSA on CPAP 04/05/2012   RSD lower limb    Testosterone deficiency    Past Surgical History:  Procedure Laterality Date   CHOLECYSTECTOMY     COLONOSCOPY     FINGER SURGERY     HERNIA REPAIR     x2   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   PARTIAL KNEE ARTHROPLASTY Left 09/26/2024   Procedure: ARTHROPLASTY, KNEE, UNICOMPARTMENTAL;  Surgeon: Josefina Chew, MD;  Location: [facility];  Service: Orthopedics;  Laterality: Left;   WISDOM TOOTH EXTRACTION     Patient Active Problem List   Diagnosis Date Noted   Chronic pain of left knee 11/01/2023   Encounter for general adult medical examination with abnormal findings 08/23/2023   Diabetes mellitus treated with insulin  and oral medication (HCC) 08/23/2023   Screening PSA (prostate specific antigen)  08/23/2023   Long-term current use of injectable noninsulin antidiabetic medication 08/23/2023   Diabetes mellitus, type 2 (HCC) 03/31/2022   Obesity 03/31/2022   Testosterone deficiency 03/31/2022   Ventral incisional hernia 03/26/2022   CAD (coronary artery disease), native coronary artery 02/05/2022   OSA (obstructive sleep apnea) 09/22/2017   Hyperlipidemia 12/01/2012   GERD (gastroesophageal reflux disease) 04/05/2012   Hiatal hernia 04/05/2012   Obstructive sleep apnea (adult) (pediatric) 04/05/2012   REFERRING PROVIDER: Chew Josefina MD  REFERRING DIAG: S/p left unilateral knee arthroplasty.    THERAPY DIAG:  Chronic pain of left knee  Muscle weakness (generalized)  Localized edema  Stiffness of left knee, not elsewhere classified  Rationale for Evaluation and Treatment: Rehabilitation  ONSET DATE: 09/26/24 (surgery date).    SUBJECTIVE:   SUBJECTIVE STATEMENT: Pt reports he was able to walk 7000 steps.   PERTINENT HISTORY: See above.  Right LE surgery (1998). PAIN:  Are you having pain? No  PRECAUTIONS: Other: No U/S.    RED FLAGS: None   WEIGHT BEARING RESTRICTIONS: No  FALLS:  Has patient fallen in last 6 months? No  LIVING ENVIRONMENT: Lives with: lives with their spouse Lives in: House/apartment Has following equipment at home: Vannie - 4 wheeled  PLOF: Independent  PATIENT GOALS: Perform ADL's without knee pain.  OBJECTIVE:   PATIENT SURVEYS:  LEFS:  10/50.    EDEMA:  Circumferential: Left 4 cms > right.  PALPATION: Diffuse left knee pain.  Post-surgical dressing intact.    LOWER EXTREMITY ROM: (Eval) In supine:  Left knee extension to -5 degrees and initial flexion to 40 degrees.  He achieved 55 degrees after 10 minutes on Nustep.   10/17/24: knee flexion 100 deg on recumbent bike 10/25/24:  Passive left knee flexion to 115 degrees.  LOWER EXTREMITY MMT:  The patient is currently unable to perform an antigravity left SLR and  SAQ.    GAIT: The patient is walking safely with a Rolator with very good heel strike.                                                                                                                                  TREATMENT DATE:  11/07/24                                  EXERCISE LOG  Exercise Repetitions and Resistance Comments  Recumbent Bike Progress to seat 3 x 10 mins   Seated figure 4 stretch 2X30   Seated knee flexion foot on knee glider 2x10   Leg press eccentrics on L L1; 3x10   Half kneel to lunge X10 R&L            Blank cell = exercise not performed today   Modalities Date:  Vaso: Knee, 34 degrees; low pressure, 15 mins, Pain and Edema    11/01/24                                  EXERCISE LOG  Exercise Repetitions and Resistance Comments  Recumbent Bike Progress to seat 3 x 15 mins   Cybex Knee Flexion 50# x 4 mins   Cybex Knee Extension 10# x 4 mins   Leg Press 2 plates; seat 4; x 3 mins   Lunges 14 box to fatigue   Hamstring Stretch 30 x 5 reps    Blank cell = exercise not performed today   Modalities  Date:  Vaso: Knee, 34 degrees; low pressure, 15 mins, Pain and Edema   10/30/24:                                     EXERCISE LOG  Exercise Repetitions and Resistance Comments  Recumbent bike Progressing to seat 3 x 15 minutes    Self care  Checking knee ROM at home and discussing HEP modifications; overdoing and going to knee brace for a little bit; importance of knee strengthening with knee ROM  Standing gastroc stretch X30   Standing soleus stretch X30   Staggered sit<>stand from  mat table x10   Seated hamstring curl 50# 3x10 Both legs  Seated knee ext 10# eccentrics 3x10 L LE only  Supine knee flexion to chest with end range hamstring curl 2x10    LE elevation and vasopneumatic on low to patient's left knee x 15 minutes.   PATIENT EDUCATION:  Education details: See below. Person educated: Patient Education method:  Explanation Education comprehension: verbalized understanding, handout  HOME EXERCISE PROGRAM: HOME EXERCISE PROGRAM [962MHUR]  Standing Wall squats -  Repeat 15 Repetitions, Hold 2 Seconds, Complete 2 Sets, Perform 2 Times a Day  BALL TKE - TERMINAL KNEE EXTENSION -  Repeat 10 Repetitions, Hold 1 Second(s), Complete 2 Sets, Perform 2 Times a Day  ASSESSMENT:  CLINICAL IMPRESSION: Pt states he has been trying to work on his knee bend. Has personal goals to get to 130 deg. Able to obtain 130 passively but not actively. Continued single leg strengthening -- especially with knee eccentric quad control in flexion >80 deg.   OBJECTIVE IMPAIRMENTS: Abnormal gait, decreased activity tolerance, decreased mobility, decreased ROM, decreased strength, increased edema, and pain.   ACTIVITY LIMITATIONS: carrying, lifting, bending, standing, stairs, bed mobility, dressing, and locomotion level  PARTICIPATION LIMITATIONS: meal prep, cleaning, laundry, driving, shopping, community activity, occupation, and yard work  PERSONAL FACTORS: Time since onset of injury/illness/exacerbation are also affecting patient's functional outcome.   REHAB POTENTIAL: Good  CLINICAL DECISION MAKING: Stable/uncomplicated  EVALUATION COMPLEXITY: Low   GOALS:  SHORT TERM GOALS: Target date: 10/13/24  Ind with an initial HEP.  10/17/24: bike, lunges on steps, SLR Goal status: MET  2.  Full active left knee extension.  10/17/24: full ext in standing Goal status: MET  3.  Active left knee flexion to 90 degrees.   10/17/24: 110 deg after lunges on 14 box Goal status: MET   LONG TERM GOALS: Target date: 12/05/2024  Ind with an advanced HEP.  Goal status: MET  2.  Active left knee flexion to 115 degrees+ so the patient can perform functional tasks and do so with pain not > 2-3/10.   11/07/24: >120 deg actively, 130 deg passively Goal status: MET  3.  Increase left hip and knee strength to a solid 4+/5 to  provide good stability for accomplishment of functional activities.  11/07/24: level 1 leg press on L LE only Goal status: IN PROGRESS  4.  Perform a reciprocating stair gait with one railing with pain not > 2-3/10.   11/07/24: Reciprocal with 4 steps but unable on 6 Goal status: IN PROGRESS  5.  Improve LEFS score by at least 30 points.  Goal status: IN PROGRESS  PLAN:  PT FREQUENCY: 1x/week  PT DURATION: 4 weeks  PLANNED INTERVENTIONS: 97110-Therapeutic exercises, 97530- Therapeutic activity, [id number]- Neuromuscular re-education, 97535- Self Care, 02859- Manual therapy, G0283- Electrical stimulation (unattended), 97016- Vasopneumatic device, Patient/Family education, and Cryotherapy  PLAN FOR NEXT SESSION: Nustep, PROM, VMS to quads PRN.   Vasopneumatic.     Renato Spellman April Ma L [place], PT,  11/07/2024, 4:33 PM  "

## 2024-11-14 ENCOUNTER — Encounter: Payer: Self-pay | Admitting: *Deleted

## 2024-11-14 ENCOUNTER — Ambulatory Visit: Admitting: Cardiology

## 2024-11-14 ENCOUNTER — Ambulatory Visit: Admitting: *Deleted

## 2024-11-14 DIAGNOSIS — R600 Localized edema: Secondary | ICD-10-CM

## 2024-11-14 DIAGNOSIS — M25562 Pain in left knee: Secondary | ICD-10-CM | POA: Diagnosis not present

## 2024-11-14 DIAGNOSIS — G8929 Other chronic pain: Secondary | ICD-10-CM

## 2024-11-14 DIAGNOSIS — M25662 Stiffness of left knee, not elsewhere classified: Secondary | ICD-10-CM

## 2024-11-14 DIAGNOSIS — M6281 Muscle weakness (generalized): Secondary | ICD-10-CM

## 2024-11-14 NOTE — Therapy (Addendum)
 " OUTPATIENT PHYSICAL THERAPY LOWER EXTREMITY TREATMENT AND RE-CERT  Patient Name: Jorge [name] MRN: [id number] DOB:[birth year], 53 y.o., male Today's Date: 11/14/2024  END OF SESSION:  PT End of Session - 11/14/24 1430     Visit Number 13    Number of Visits 16    Date for Recertification  12/05/24    PT Start Time 1431    PT Stop Time 1520    PT Time Calculation (min) 49 min           Past Medical History:  Diagnosis Date   Arthritis    CAD (coronary artery disease), native coronary artery    Coronary CTA showed mild nonobstructive CAD 25 to 49% stenosis in the mid RCA.   Complication of anesthesia    woke up early during dental surgery- tried to run away   Diabetes mellitus type 2, controlled (HCC)    Family history of adverse reaction to anesthesia    strong family history of MVP   GERD (gastroesophageal reflux disease) 04/05/2012   Hiatal hernia 04/05/2012   Hyperlipidemia 12/01/2012   HE PREFERS TO USE NIACIN AND BRAN TO LOWER HIS TRIGLYCERIDES AND CHOLESTEROL   Obesity    OSA on CPAP 04/05/2012   RSD lower limb    Testosterone deficiency    Past Surgical History:  Procedure Laterality Date   CHOLECYSTECTOMY     COLONOSCOPY     FINGER SURGERY     HERNIA REPAIR     x2   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   PARTIAL KNEE ARTHROPLASTY Left 09/26/2024   Procedure: ARTHROPLASTY, KNEE, UNICOMPARTMENTAL;  Surgeon: Josefina Chew, MD;  Location: [facility];  Service: Orthopedics;  Laterality: Left;   WISDOM TOOTH EXTRACTION     Patient Active Problem List   Diagnosis Date Noted   Chronic pain of left knee 11/01/2023   Encounter for general adult medical examination with abnormal findings 08/23/2023   Diabetes mellitus treated with insulin  and oral medication (HCC) 08/23/2023   Screening PSA (prostate specific antigen) 08/23/2023   Long-term current use of injectable noninsulin antidiabetic medication 08/23/2023   Diabetes mellitus, type 2  (HCC) 03/31/2022   Obesity 03/31/2022   Testosterone deficiency 03/31/2022   Ventral incisional hernia 03/26/2022   CAD (coronary artery disease), native coronary artery 02/05/2022   OSA (obstructive sleep apnea) 09/22/2017   Hyperlipidemia 12/01/2012   GERD (gastroesophageal reflux disease) 04/05/2012   Hiatal hernia 04/05/2012   Obstructive sleep apnea (adult) (pediatric) 04/05/2012   REFERRING PROVIDER: Chew Josefina MD  REFERRING DIAG: S/p left unilateral knee arthroplasty.    THERAPY DIAG:  Chronic pain of left knee  Muscle weakness (generalized)  Localized edema  Stiffness of left knee, not elsewhere classified  Rationale for Evaluation and Treatment: Rehabilitation  ONSET DATE: 09/26/24 (surgery date).    SUBJECTIVE:   SUBJECTIVE STATEMENT: Pt reports he is doing better and will DC today due to deductible in new year.  PERTINENT HISTORY: See above.  Right LE surgery (1998). PAIN:  Are you having pain? No  PRECAUTIONS: Other: No U/S.    RED FLAGS: None   WEIGHT BEARING RESTRICTIONS: No  FALLS:  Has patient fallen in last 6 months? No  LIVING ENVIRONMENT: Lives with: lives with their spouse Lives in: House/apartment Has following equipment at home: Vannie - 4 wheeled  PLOF: Independent  PATIENT GOALS: Perform ADL's without knee pain.    OBJECTIVE:   PATIENT SURVEYS:  LEFS:  10/50.  EDEMA:  Circumferential: Left 4 cms > right.  PALPATION: Diffuse left knee pain.  Post-surgical dressing intact.    LOWER EXTREMITY ROM: (Eval) In supine:  Left knee extension to -5 degrees and initial flexion to 40 degrees.  He achieved 55 degrees after 10 minutes on Nustep.   10/17/24: knee flexion 100 deg on recumbent bike 10/25/24:  Passive left knee flexion to 115 degrees.  LOWER EXTREMITY MMT:  The patient is currently unable to perform an antigravity left SLR and SAQ.    GAIT: The patient is walking safely with a Rolator with very good heel strike.                                                                                                                                   TREATMENT DATE:  11/14/24                                  EXERCISE LOG   LT knee  Exercise Repetitions and Resistance Comments  Recumbent Bike Progress to seat 3 x 15 mins   Seated figure 4 stretch 2X30   Seated knee flexion foot on knee glider    6in and 8 in step up 3x10   Half kneel to lunge X10 R&L   Elliptical L5   R5  x 10 mins   Supine figure 4 stretch X 5  x 30 secs    Blank cell = exercise not performed today  Discussed HEP and GYM therex Modalities Date:  Vaso: Knee, 34 degrees; low pressure, 15 mins, Pain and Edema    11/01/24                                  EXERCISE LOG  Exercise Repetitions and Resistance Comments  Recumbent Bike Progress to seat 3 x 15 mins   Cybex Knee Flexion 50# x 4 mins   Cybex Knee Extension 10# x 4 mins   Leg Press 2 plates; seat 4; x 3 mins   Lunges 14 box to fatigue   Hamstring Stretch 30 x 5 reps    Blank cell = exercise not performed today   Modalities  Date:  Vaso: Knee, 34 degrees; low pressure, 15 mins, Pain and Edema   10/30/24:                                     EXERCISE LOG  Exercise Repetitions and Resistance Comments  Recumbent bike Progressing to seat 3 x 15 minutes    Self care  Checking knee ROM at home and discussing HEP modifications; overdoing and going to knee brace for a little bit; importance of knee strengthening with knee ROM  Standing gastroc stretch X30  Standing soleus stretch X30   Staggered sit<>stand from mat table x10   Seated hamstring curl 50# 3x10 Both legs  Seated knee ext 10# eccentrics 3x10 L LE only  Supine knee flexion to chest with end range hamstring curl 2x10    LE elevation and vasopneumatic on low to patient's left knee x 15 minutes.   PATIENT EDUCATION:  Education details: See below. Person educated: Patient Education method:  Explanation Education comprehension: verbalized understanding, handout  HOME EXERCISE PROGRAM: HOME EXERCISE PROGRAM [962MHUR]  Standing Wall squats -  Repeat 15 Repetitions, Hold 2 Seconds, Complete 2 Sets, Perform 2 Times a Day  BALL TKE - TERMINAL KNEE EXTENSION -  Repeat 10 Repetitions, Hold 1 Second(s), Complete 2 Sets, Perform 2 Times a Day  ASSESSMENT:  CLINICAL IMPRESSION: Pt states he has been trying to work on his knee and hip bending and needs to DC due to deductible.   Has personal goals to get to 130 deg and was  able to obtain 130 passively but not actively.Rx focused on HEP and GYM exs/act.s. Majority of LTG's met.   OBJECTIVE IMPAIRMENTS: Abnormal gait, decreased activity tolerance, decreased mobility, decreased ROM, decreased strength, increased edema, and pain.   ACTIVITY LIMITATIONS: carrying, lifting, bending, standing, stairs, bed mobility, dressing, and locomotion level  PARTICIPATION LIMITATIONS: meal prep, cleaning, laundry, driving, shopping, community activity, occupation, and yard work  PERSONAL FACTORS: Time since onset of injury/illness/exacerbation are also affecting patient's functional outcome.   REHAB POTENTIAL: Good  CLINICAL DECISION MAKING: Stable/uncomplicated  EVALUATION COMPLEXITY: Low   GOALS:  SHORT TERM GOALS: Target date: 10/13/24  Ind with an initial HEP.  10/17/24: bike, lunges on steps, SLR Goal status: MET  2.  Full active left knee extension.  10/17/24: full ext in standing Goal status: MET  3.  Active left knee flexion to 90 degrees.   10/17/24: 110 deg after lunges on 14 box Goal status: MET   LONG TERM GOALS: Target date: 12/05/2024  Ind with an advanced HEP.  Goal status: MET  2.  Active left knee flexion to 115 degrees+ so the patient can perform functional tasks and do so with pain not > 2-3/10.   11/07/24: >120 deg actively, 130 deg passively Goal status: MET  3.  Increase left hip and knee strength to a  solid 4+/5 to provide good stability for accomplishment of functional activities.  11/07/24: level 1 leg press on L LE only Goal status: NM 4/5  4.  Perform a reciprocating stair gait with one railing with pain not > 2-3/10.   11/07/24: Reciprocal with 4 steps but unable on 6 Goal status: NM    weakness still  5.  Improve LEFS score by at least 30 points.  Goal status:  MET          52/80  PLAN:  PT FREQUENCY: 1x/week  PT DURATION: 4 weeks  PLANNED INTERVENTIONS: 97110-Therapeutic exercises, 97530- Therapeutic activity, [id number]- Neuromuscular re-education, 97535- Self Care, 02859- Manual therapy, G0283- Electrical stimulation (unattended), 97016- Vasopneumatic device, Patient/Family education, and Cryotherapy  PLAN: DC to HEP/GYM   Vasopneumatic.     Jesiah Grismer,CHRIS, PTA,  11/14/2024, 4:41 PM   PHYSICAL THERAPY DISCHARGE SUMMARY  Visits from Start of Care: 13.  Current functional level related to goals / functional outcomes: See above.     Remaining deficits: LTG's #1,2 and 5 met.  LTG's #2 and #4 unmet.     Education / Equipment: HEP.    Patient agrees to discharge. Patient goals  were partially met. Patient is being discharged due to being pleased with the current functional level.    Chad Applegate MPT   "

## 2024-12-06 ENCOUNTER — Other Ambulatory Visit: Payer: Self-pay | Admitting: Cardiology

## 2024-12-06 NOTE — Telephone Encounter (Signed)
 Lipid Panel within 12 months   -02/29/24 Abn

## 2024-12-15 ENCOUNTER — Other Ambulatory Visit: Payer: Self-pay | Admitting: Surgery

## 2024-12-15 DIAGNOSIS — K432 Incisional hernia without obstruction or gangrene: Secondary | ICD-10-CM

## 2024-12-18 ENCOUNTER — Telehealth: Payer: Self-pay

## 2024-12-18 NOTE — Telephone Encounter (Signed)
 Jorge [name]

## 2024-12-19 ENCOUNTER — Telehealth: Payer: Self-pay

## 2024-12-19 MED ORDER — PREDNISONE 50 MG PO TABS: ORAL_TABLET | ORAL | 0 refills | Status: AC

## 2024-12-19 NOTE — Telephone Encounter (Signed)
 13 hour prep for scan on 12/21/24. Times to be given 2120,0320,0920.

## 2024-12-20 ENCOUNTER — Encounter: Payer: Self-pay | Admitting: Cardiology

## 2024-12-20 ENCOUNTER — Ambulatory Visit: Admitting: Cardiology

## 2024-12-20 VITALS — BP 130/86 | HR 97 | Resp 16 | Ht 72.0 in | Wt 231.6 lb

## 2024-12-20 DIAGNOSIS — G4733 Obstructive sleep apnea (adult) (pediatric): Secondary | ICD-10-CM

## 2024-12-20 DIAGNOSIS — E7800 Pure hypercholesterolemia, unspecified: Secondary | ICD-10-CM | POA: Diagnosis not present

## 2024-12-20 DIAGNOSIS — I2510 Atherosclerotic heart disease of native coronary artery without angina pectoris: Secondary | ICD-10-CM

## 2024-12-20 DIAGNOSIS — Z79899 Other long term (current) drug therapy: Secondary | ICD-10-CM | POA: Diagnosis not present

## 2024-12-20 MED ORDER — ROSUVASTATIN CALCIUM 10 MG PO TABS: 10.0000 mg | ORAL_TABLET | Freq: Every day | ORAL | 3 refills | Status: AC

## 2024-12-20 NOTE — Progress Notes (Signed)
 "   Date:  12/20/2024   ID:  Jorge [name], DOB 1971/06/23, MRN [id number] The patient was identified using 2 identifiers.  PCP:  Deitra Morton [name] Nena, NP   [facility] Providers Cardiologist:  Wilbert Bihari, MD Sleep Medicine:  Wilbert Bihari, MD     Evaluation Performed:  Follow-Up Visit  Chief Complaint:  OSA  History of Present Illness:    Jorge [name] is a 54 y.o. male with  a hx of obstructive sleep apnea on CPAP, CAD and hyperlipidemia. His OSA was initially dx in 2010 with sleep study showing moderate OSA with an AHI of 21/hr but increased to 68/hr during REM sleep.  He was placed on CPAP at 14cm H2O.    He underwent coronary CTA in March which showed a coronary calcium  score of 0 with mild soft nonobstructive plaque in the mid RCA, mid LAD of 25 to 49%.  Stress PET CT 79879 [id number] for chest pain showed no ischemia.  The patient is here today and is doing well.  He denies any CP or pressure, SOB, DOE, PND, orthopnea, LE edema, dizziness, palpitations or syncope.  He is doing well with his PAP device.  He tolerates the nasal pillow mask and feels the pressure is adequate.  He still does not feel rested when he gets up but has no significant daytime sleepiness.  He denies any significant mouth or nasal dryness or nasal congestion.  He does not think that he snores. An Epworth Sleepiness Scale score was calculated the office today and this endorsed at 7 arguing against residual daytime sleepiness. Patient denies any episodes of bruxism, restless legs, No hypnogognic hallucinations or cataplectic events.    Past Medical History:  Diagnosis Date   Arthritis    CAD (coronary artery disease), native coronary artery    Coronary CTA showed mild nonobstructive CAD 25 to 49% stenosis in the mid RCA.   Complication of anesthesia    woke up early during dental surgery- tried to run away   Diabetes mellitus type 2, controlled (HCC)    Family history of adverse reaction to anesthesia     strong family history of MVP   GERD (gastroesophageal reflux disease) 04/05/2012   Hiatal hernia 04/05/2012   Hyperlipidemia 12/01/2012   HE PREFERS TO USE NIACIN AND BRAN TO LOWER HIS TRIGLYCERIDES AND CHOLESTEROL   Obesity    OSA on CPAP 04/05/2012   RSD lower limb    Testosterone deficiency    Past Surgical History:  Procedure Laterality Date   CHOLECYSTECTOMY     COLONOSCOPY     FINGER SURGERY     HERNIA REPAIR     x2   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   PARTIAL KNEE ARTHROPLASTY Left 09/26/2024   Procedure: ARTHROPLASTY, KNEE, UNICOMPARTMENTAL;  Surgeon: Josefina Chew, MD;  Location: [facility];  Service: Orthopedics;  Laterality: Left;   WISDOM TOOTH EXTRACTION       Current Meds  Medication Sig   baclofen  (LIORESAL ) 10 MG tablet Take 1 tablet (10 mg total) by mouth 3 (three) times daily. As needed for muscle spasm   CINNAMON PO Take 1 tablet by mouth daily.   diphenhydrAMINE  (BENADRYL ) 50 MG tablet Take tablet by mouth 1 hour prior to your cardiac MRI.   esomeprazole (NEXIUM) 20 MG capsule Take 20 mg by mouth in the morning and at bedtime.   gabapentin (NEURONTIN) 300 MG capsule Take 300 mg by mouth 2 (two) times daily.  GARLIC PO Take 1 tablet by mouth See admin instructions. Take 1 tablet by mouth daily every other month.   metFORMIN (GLUCOPHAGE-XR) 500 MG 24 hr tablet Take 1,000 mg by mouth 2 (two) times daily.   MOUNJARO 12.5 MG/0.5ML Pen Inject 12.5 mg into the skin once a week.   NIACIN CR PO Take 1 tablet by mouth See admin instructions. Take 1 tablet by mouth daily every other month.   NON FORMULARY Pt uses a c-pap nightly   OAT BRAN PO Take 1 capsule by mouth 2 (two) times daily.   Omega-3 Fatty Acids (FISH OIL) 1000 MG CAPS Take 1,000 mg by mouth daily.   ondansetron  (ZOFRAN ) 4 MG tablet Take 1 tablet (4 mg total) by mouth every 8 (eight) hours as needed for nausea or vomiting.   oxyCODONE  (ROXICODONE ) 5 MG immediate release tablet Take  1 tablet (5 mg total) by mouth every 4 (four) hours as needed for severe pain (pain score 7-10).   predniSONE  (DELTASONE ) 50 MG tablet Take tablet by mouth 13 hours, 7 hours, 1 hour prior to your cardiac MRI.   predniSONE  (DELTASONE ) 50 MG tablet Pt to take 50 mg of prednisone  on 12/20/24 at 9:20 pm, 50 mg of prednisone  on 12/21/24  at 3:20 am, and 50 mg of prednisone  on 12/21/24 at 9:20 am. Pt is also to take 50 mg of benadryl  on 12/21/24 at 9:20 am. Please call [phone] with any questions.   rivaroxaban  (XARELTO ) 10 MG TABS tablet Take 1 tablet (10 mg total) by mouth daily. For 30 days for DVT prophylaxis   rosuvastatin  (CRESTOR ) 10 MG tablet TAKE 1 TABLET BY MOUTH EVERY DAY   sennosides-docusate sodium  (SENOKOT-S) 8.6-50 MG tablet Take 2 tablets by mouth daily.   SYRINGE-NEEDLE, DISP, 3 ML 23G X 1 3 ML MISC 1 each by Does not apply route. 1 EACH BY DOES NOT APPLY ROUTE EVERY 10 DAYS   testosterone cypionate (DEPOTESTOSTERONE CYPIONATE) 200 MG/ML injection Inject 100 mg into the muscle every 7 (seven) days.   TRESIBA FLEXTOUCH 200 UNIT/ML FlexTouch Pen Inject 70 Units into the skin at bedtime.     Allergies:   Iodinated contrast media   Social History   Tobacco Use   Smoking status: Former    Current packs/day: 0.00    Types: Cigarettes    Quit date: 09/22/2012    Years since quitting: 12.2   Smokeless tobacco: Never  Vaping Use   Vaping status: Never Used  Substance Use Topics   Alcohol use: No   Drug use: No     Family Hx: The patient's family history includes Alzheimer's disease in his mother; Valvular heart disease in his brother and father.  ROS:   Please see the history of present illness.     All other systems reviewed and are negative.   Prior CV studies:   The following studies were reviewed today:  Coronary CTA and PAP compliance download  Labs/Other Tests and Data Reviewed:    EKG Interpretation Date/Time:  Wednesday December 20 2024 08:00:31 EST Ventricular  Rate:  99 PR Interval:  150 QRS Duration:  86 QT Interval:  370 QTC Calculation: 474 R Axis:   81  Text Interpretation: Normal sinus rhythm T wave abnormality, consider inferior ischemia When compared with ECG of 04-Oct-2023 08:44, Inverted T waves have replaced nonspecific T wave abnormality in Inferior leads Confirmed by Shlomo Corning ([id number]) on 12/20/2024 8:18:56 AM    Recent Labs: 09/19/2024: BUN 17; Creatinine, Ser 0.97;  Hemoglobin 15.7; Platelets 199; Potassium 4.5; Sodium 139   Recent Lipid Panel Lab Results  Component Value Date/Time   CHOL 81 (L) 08/23/2023 04:52 PM   TRIG 94 08/23/2023 04:52 PM   HDL 37 (L) 08/23/2023 04:52 PM   CHOLHDL 2.2 08/23/2023 04:52 PM   LDLCALC 26 08/23/2023 04:52 PM    Wt Readings from Last 3 Encounters:  12/20/24 231 lb 9.6 oz (105.1 kg)  09/26/24 243 lb (110.2 kg)  09/19/24 243 lb (110.2 kg)     Risk Assessment/Calculations:          Objective:    Vital Signs:  BP 130/86 (BP Location: Left Arm, Patient Position: Sitting, Cuff Size: Large)   Pulse 97   Resp 16   Ht 6' (1.829 m)   Wt 231 lb 9.6 oz (105.1 kg)   SpO2 97%   BMI 31.41 kg/m   GEN: Well nourished, well developed in no acute distress HEENT: Normal NECK: No JVD; No carotid bruits LYMPHATICS: No lymphadenopathy CARDIAC:RRR, no murmurs, rubs, gallops RESPIRATORY:  Clear to auscultation without rales, wheezing or rhonchi  ABDOMEN: Soft, non-tender, non-distended MUSCULOSKELETAL:  No edema; No deformity  SKIN: Warm and dry NEUROLOGIC:  Alert and oriented x 3 PSYCHIATRIC:  Normal affect  ASSESSMENT & PLAN:    OSA - The patient is tolerating PAP therapy well without any problems. The PAP download performed by his DME was personally reviewed and interpreted by me today and showed an AHI of 0.7 /hr on 12 cm H2O with 100 % compliance in using more than 4 hours nightly.  The patient has been using and benefiting from PAP use and will continue to benefit from therapy.    CAD -Coronary CTA showed mild soft nonobstructive plaque in the mid LAD and RCA with 25 to 49% stenosis -He has had some sporadic chest pain but with no associated sx -Stress PET/CT was done 12/06/2023 for chest pain which showed no ischemia with mild LV dysfunction EF 41% at rest and 47% with stress -He denies any anginal symptoms -Continue ASA 81mg  daily and Crestor  10 mg daily with as needed refills  Hyperlipidemia -LDL goal <70 due to CAD  -Check FLP and ALT  -Continue Crestor  10 mg daily with as needed refills  DCM -EF 45-50%, G1DD -normal cMRI with EF 50% 01/2024  Medication Adjustments/Labs and Tests Ordered: Current medicines are reviewed at length with the patient today.  Concerns regarding medicines are outlined above.   Tests Ordered: Orders Placed This Encounter  Procedures   EKG 12-Lead    Medication Changes: No orders of the defined types were placed in this encounter.   Follow Up:  In Person in 1 year(s)  Signed, Wilbert Bihari, MD  12/20/2024 8:29 AM    [facility]  "

## 2024-12-20 NOTE — Patient Instructions (Addendum)
 Medication Instructions:  Your physician recommends that you continue on your current medications as directed. Please refer to the Current Medication list given to you today.   *If you need a refill on your cardiac medications before your next appointment, please call your pharmacy*  Lab Work: Please complete a fasting lipid panel and an ALT in our first floor lab before your leave today.  If you have labs (blood work) drawn today and your tests are completely normal, you will receive your results only by: MyChart Message (if you have MyChart) OR A paper copy in the mail If you have any lab test that is abnormal or we need to change your treatment, we will call you to review the results.  Testing/Procedures: None.  Follow-Up: At [facility], you and your health needs are our priority.  As part of our continuing mission to provide you with exceptional heart care, our providers are all part of one team.  This team includes your primary Cardiologist (physician) and Advanced Practice Providers or APPs (Physician Assistants and Nurse Practitioners) who all work together to provide you with the care you need, when you need it.  Your next appointment:   1 year(s)  Provider:   Wilbert Bihari, MD

## 2024-12-20 NOTE — Addendum Note (Signed)
 Addended by: JANIT GENI CROME on: 12/20/2024 08:42 AM   Modules accepted: Orders

## 2024-12-20 NOTE — Addendum Note (Signed)
 Addended by: JANIT GENI CROME on: 12/20/2024 08:47 AM   Modules accepted: Orders

## 2024-12-21 ENCOUNTER — Inpatient Hospital Stay: Admission: RE | Admit: 2024-12-21 | Discharge: 2024-12-21 | Attending: Surgery | Admitting: Surgery

## 2024-12-21 DIAGNOSIS — K432 Incisional hernia without obstruction or gangrene: Secondary | ICD-10-CM

## 2024-12-21 LAB — LIPID PANEL
Chol/HDL Ratio: 2.9 ratio (ref 0.0–5.0)
Cholesterol, Total: 106 mg/dL (ref 100–199)
HDL: 37 mg/dL — ABNORMAL LOW
LDL Chol Calc (NIH): 44 mg/dL (ref 0–99)
Triglycerides: 141 mg/dL (ref 0–149)
VLDL Cholesterol Cal: 25 mg/dL (ref 5–40)

## 2024-12-21 LAB — ALT: ALT: 25 [IU]/L (ref 0–44)

## 2024-12-21 MED ORDER — IOPAMIDOL (ISOVUE-300) INJECTION 61%
100.0000 mL | Freq: Once | INTRAVENOUS | Status: AC | PRN
  Administered 2024-12-21: 100 mL via INTRAVENOUS

## 2024-12-21 NOTE — Progress Notes (Signed)
 This RN called to CT scan for possible contrast reaction, pt remains on CT table, alert and oriented, denies pain or difficulty breathing, few, mild pink spots to head and neck area, as well as right shoulder area, c/o mild itching, Dr. Ramond to see pt in CT waiting area, no new medication orders at this time, pt took 13 hour prep prior to scan, Pt to stay for approximately 15 minutes to be observed, safety maintained

## 2024-12-21 NOTE — Progress Notes (Signed)
 Dr Ramond to pre/post area to see pt, VS recorded, pt denies pain or shortness of breath, ok for pt to discharge home, safety maintained  Pt encouraged to have future MRI/CT scans at hospital for possible breath through reactions per Dr. Ramond
